# Patient Record
Sex: Male | Born: 1995 | Race: White | Hispanic: No | Marital: Single | Smoking: Never smoker
Health system: Southern US, Community
[De-identification: ages and names within clinical notes are randomized; demographics above are authoritative.]

## PROBLEM LIST (undated history)

## (undated) DIAGNOSIS — F419 Anxiety disorder, unspecified: Secondary | ICD-10-CM

## (undated) HISTORY — DX: Anxiety disorder, unspecified: F41.9

---
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
## 2013-03-20 ENCOUNTER — Telehealth: Payer: Self-pay | Admitting: Internal Medicine

## 2013-03-20 NOTE — Telephone Encounter (Signed)
Patient requesting copies of childhood shot records. Ordered chart from warehouse for possible copy. Instructed patient [facility] Department resource for copies. rmf

## 2014-04-02 ENCOUNTER — Encounter: Payer: Self-pay | Admitting: Internal Medicine

## 2014-04-02 ENCOUNTER — Ambulatory Visit (INDEPENDENT_AMBULATORY_CARE_PROVIDER_SITE_OTHER): Payer: 59 | Admitting: Internal Medicine

## 2014-04-02 ENCOUNTER — Telehealth: Payer: Self-pay | Admitting: Internal Medicine

## 2014-04-02 VITALS — BP 122/70 | HR 68 | Temp 98.3°F | Resp 16 | Wt 203.0 lb

## 2014-04-02 DIAGNOSIS — R635 Abnormal weight gain: Secondary | ICD-10-CM

## 2014-04-02 DIAGNOSIS — T63441A Toxic effect of venom of bees, accidental (unintentional), initial encounter: Secondary | ICD-10-CM | POA: Insufficient documentation

## 2014-04-02 DIAGNOSIS — T63441S Toxic effect of venom of bees, accidental (unintentional), sequela: Secondary | ICD-10-CM

## 2014-04-02 DIAGNOSIS — T6591XS Toxic effect of unspecified substance, accidental (unintentional), sequela: Secondary | ICD-10-CM

## 2014-04-02 DIAGNOSIS — B081 Molluscum contagiosum: Secondary | ICD-10-CM

## 2014-04-02 DIAGNOSIS — R634 Abnormal weight loss: Secondary | ICD-10-CM

## 2014-04-02 DIAGNOSIS — F411 Generalized anxiety disorder: Secondary | ICD-10-CM

## 2014-04-02 DIAGNOSIS — T6391XA Toxic effect of contact with unspecified venomous animal, accidental (unintentional), initial encounter: Secondary | ICD-10-CM

## 2014-04-02 DIAGNOSIS — Z0000 Encounter for general adult medical examination without abnormal findings: Secondary | ICD-10-CM

## 2014-04-02 MED ORDER — LORATADINE 10 MG PO TABS: 10.0000 mg | ORAL_TABLET | Freq: Every day | ORAL | Status: DC | PRN

## 2014-04-02 MED ORDER — TRIAMCINOLONE ACETONIDE 0.5 % EX CREA
1.0000 | TOPICAL_CREAM | Freq: Four times a day (QID) | CUTANEOUS | Status: DC
Start: 2014-04-02 — End: 2015-08-11

## 2014-04-02 NOTE — Assessment & Plan Note (Signed)
Wt Readings from Last 3 Encounters:  04/02/14 203 lb (92.08 kg) (93%*, Z = 1.50)  01/27/10 204 lb 8 oz (92.761 kg) (99%*, Z = 2.23)  08/08/09 201 lb (91.173 kg) (99%*, Z = 2.29)   * Growth percentiles are based on CDC 2-20 Years data.

## 2014-04-02 NOTE — Assessment & Plan Note (Signed)
Triamc Rx Zyrtec

## 2014-04-02 NOTE — Telephone Encounter (Signed)
Patient's mother left message.  (She is not on his [employer] form)  She states that Eduin should have asked for blood work to be done when he was in for office visit today.  She states that he has lost 50 lbs and she wants to be sure that he is okay.

## 2014-04-02 NOTE — Telephone Encounter (Signed)
Sorry, will order labs: he can come today or next week. He did not mention labs. It sounds like he lost wt because he is eating better. Thx

## 2014-04-02 NOTE — Progress Notes (Signed)
Pre visit review using our clinic review tool, if applicable. No additional management support is needed unless otherwise documented below in the visit note. 

## 2014-04-02 NOTE — Assessment & Plan Note (Signed)
Discused Triamc cream Rx

## 2014-04-03 ENCOUNTER — Telehealth: Payer: Self-pay | Admitting: Internal Medicine

## 2014-04-03 NOTE — Telephone Encounter (Signed)
Relevant patient education assigned to patient using Emmi. ° °

## 2014-04-07 NOTE — Telephone Encounter (Signed)
Left detailed mess informing pt of below.  

## 2014-06-06 ENCOUNTER — Encounter: Payer: Self-pay | Admitting: Internal Medicine

## 2014-06-06 NOTE — Assessment & Plan Note (Addendum)
2014-15 Separation anxiety Counseling at [facility]

## 2014-06-10 ENCOUNTER — Telehealth: Payer: Self-pay | Admitting: Internal Medicine

## 2014-06-10 ENCOUNTER — Ambulatory Visit (INDEPENDENT_AMBULATORY_CARE_PROVIDER_SITE_OTHER): Payer: BC Managed Care – PPO

## 2014-06-10 DIAGNOSIS — R718 Other abnormality of red blood cells: Secondary | ICD-10-CM

## 2014-06-10 DIAGNOSIS — F4322 Adjustment disorder with anxiety: Secondary | ICD-10-CM

## 2014-06-10 DIAGNOSIS — R634 Abnormal weight loss: Secondary | ICD-10-CM

## 2014-06-10 DIAGNOSIS — Z0000 Encounter for general adult medical examination without abnormal findings: Secondary | ICD-10-CM

## 2014-06-10 LAB — BASIC METABOLIC PANEL
BUN: 13 mg/dL (ref 6–23)
CO2: 30 meq/L (ref 19–32)
CREATININE: 1 mg/dL (ref 0.4–1.5)
Calcium: 9.7 mg/dL (ref 8.4–10.5)
Chloride: 102 mEq/L (ref 96–112)
GFR: 99.2 mL/min (ref >60.00)
GLUCOSE: 89 mg/dL (ref 70–99)
Potassium: 4.3 mEq/L (ref 3.5–5.1)
SODIUM: 138 meq/L (ref 135–145)

## 2014-06-10 LAB — CBC WITH DIFFERENTIAL/PLATELET
Basophils Absolute: 0 10*3/uL (ref 0.0–0.1)
Basophils Relative: 0.4 % (ref 0.0–3.0)
EOS ABS: 0.3 10*3/uL (ref 0.0–0.7)
Eosinophils Relative: 3.3 % (ref 0.0–5.0)
HCT: 49.5 % — ABNORMAL HIGH (ref 36.0–49.0)
HEMOGLOBIN: 17 g/dL — AB (ref 12.0–16.0)
LYMPHS PCT: 32.8 % (ref 24.0–48.0)
Lymphs Abs: 2.7 10*3/uL (ref 0.7–4.0)
MCHC: 34.4 g/dL (ref 31.0–37.0)
MCV: 89.7 fl (ref 78.0–98.0)
MONOS PCT: 8 % (ref 3.0–12.0)
Monocytes Absolute: 0.7 10*3/uL (ref 0.1–1.0)
NEUTROS ABS: 4.5 10*3/uL (ref 1.4–7.7)
Neutrophils Relative %: 55.5 % (ref 43.0–71.0)
Platelets: 256 10*3/uL (ref 150.0–575.0)
RBC: 5.52 Mil/uL (ref 3.80–5.70)
RDW: 13.4 % (ref 11.4–15.5)
WBC: 8.2 10*3/uL (ref 4.5–13.5)

## 2014-06-10 LAB — URINALYSIS
BILIRUBIN URINE: NEGATIVE
HGB URINE DIPSTICK: NEGATIVE
KETONES UR: NEGATIVE
Leukocytes, UA: NEGATIVE
Nitrite: NEGATIVE
SPECIFIC GRAVITY, URINE: 1.02 (ref 1.000–1.030)
Total Protein, Urine: NEGATIVE
URINE GLUCOSE: NEGATIVE
Urobilinogen, UA: 0.2 (ref 0.0–1.0)
pH: 5.5 (ref 5.0–8.0)

## 2014-06-10 LAB — LIPID PANEL
CHOL/HDL RATIO: 3
Cholesterol: 153 mg/dL (ref 0–200)
HDL: 54.5 mg/dL (ref >39.00)
LDL CALC: 73 mg/dL (ref 0–99)
NONHDL: 98.5
Triglycerides: 127 mg/dL (ref 0.0–149.0)
VLDL: 25.4 mg/dL (ref 0.0–40.0)

## 2014-06-10 LAB — HEPATIC FUNCTION PANEL
ALK PHOS: 55 U/L (ref 52–171)
ALT: 14 U/L (ref 0–53)
AST: 18 U/L (ref 0–37)
Albumin: 4.4 g/dL (ref 3.5–5.2)
BILIRUBIN TOTAL: 0.7 mg/dL (ref 0.2–1.2)
Bilirubin, Direct: 0.1 mg/dL (ref 0.0–0.3)
Total Protein: 7.9 g/dL (ref 6.0–8.3)

## 2014-06-10 LAB — TSH: TSH: 1.58 u[IU]/mL (ref 0.40–5.00)

## 2014-06-10 LAB — HIV ANTIBODY (ROUTINE TESTING W REFLEX): HIV: NONREACTIVE

## 2014-06-10 LAB — VITAMIN B12: Vitamin B-12: 489 pg/mL (ref 211–911)

## 2014-06-10 NOTE — Telephone Encounter (Signed)
Patient's Mom is requesting a call back regarding labs. She wants to know which labs are "necessary" for him to have drawn out of the ones standing in his chart. Says that [facility] faxed over some lab results that he had drawn recently and he does not want him to have the same ones duplicated. Please advise.

## 2014-06-10 NOTE — Telephone Encounter (Signed)
Have you seen labs from [facility]? What labs do you want ran?

## 2014-06-10 NOTE — Telephone Encounter (Signed)
I saw [facility] labs on paper, but I do not see them scanned in the chart. As I recall, it was CBC and BMET Thx

## 2014-06-11 NOTE — Telephone Encounter (Signed)
I called pt's mother- Copy of pt's labs are upfront for her to p/u. She is leaving her copy of his [facility] labs.   She is requesting your recommendation on a forensic psychiatrist or psychologist that is not very aggressive and that would be a good match with pt. Please advise.

## 2014-06-14 NOTE — Telephone Encounter (Signed)
I spoke w/mom Will sch w/Dr Leland Johns

## 2014-06-17 ENCOUNTER — Encounter: Payer: Self-pay | Admitting: Internal Medicine

## 2014-06-17 NOTE — Progress Notes (Signed)
   Subjective:    Patient ID: Christian Mccann, male    DOB: 1996-05-17, 18 y.o.   MRN: [id number]  HPI C/o rash on the trunk, bee stings, anxiety (mom is abroad), problems at school [employer])   Review of Systems  Constitutional: Positive for unexpected weight change. Negative for appetite change and fatigue.  HENT: Negative for congestion, nosebleeds, sneezing, sore throat and trouble swallowing.   Eyes: Negative for itching and visual disturbance.  Respiratory: Negative for cough.   Cardiovascular: Negative for chest pain, palpitations and leg swelling.  Gastrointestinal: Negative for nausea, diarrhea, blood in stool and abdominal distention.  Genitourinary: Negative for frequency and hematuria.  Musculoskeletal: Negative for back pain, gait problem, joint swelling and neck pain.  Skin: Positive for rash.  Neurological: Negative for dizziness, tremors, speech difficulty and weakness.  Hematological: Does not bruise/bleed easily.  Psychiatric/Behavioral: Negative for suicidal ideas, behavioral problems, sleep disturbance, self-injury, dysphoric mood and agitation. The patient is nervous/anxious.        Objective:   Physical Exam  Constitutional: He is oriented to person, place, and time. He appears well-developed. No distress.  NAD A little overweight  HENT:  Mouth/Throat: Oropharynx is clear and moist.  Eyes: Conjunctivae are normal. Pupils are equal, round, and reactive to light.  Neck: Normal range of motion. No JVD present. No thyromegaly present.  Cardiovascular: Normal rate, regular rhythm, normal heart sounds and intact distal pulses.  Exam reveals no gallop and no friction rub.   No murmur heard. Pulmonary/Chest: Effort normal and breath sounds normal. No respiratory distress. He has no wheezes. He has no rales. He exhibits no tenderness.  Abdominal: Soft. Bowel sounds are normal. He exhibits no distension and no mass. There is no tenderness. There is no rebound and no  guarding.  Musculoskeletal: Normal range of motion. He exhibits no edema and no tenderness.  Lymphadenopathy:    He has no cervical adenopathy.  Neurological: He is alert and oriented to person, place, and time. He has normal reflexes. No cranial nerve deficit. He exhibits normal muscle tone. He displays a negative Romberg sign. Coordination and gait normal.  Skin: Skin is warm and dry. Rash noted.  Psychiatric: He has a normal mood and affect. His behavior is normal. Judgment and thought content normal.   Pearly papules on abdomen 3-4 mm each       Assessment & Plan:

## 2014-06-25 ENCOUNTER — Ambulatory Visit: Payer: Self-pay | Admitting: Psychology

## 2014-10-13 ENCOUNTER — Telehealth: Payer: Self-pay | Admitting: Internal Medicine

## 2014-10-13 NOTE — Telephone Encounter (Signed)
Received records from [facility], sent to Dr. Alain Marion. 10/13/14/ss

## 2014-10-29 ENCOUNTER — Telehealth: Payer: Self-pay | Admitting: Internal Medicine

## 2014-10-29 NOTE — Telephone Encounter (Signed)
Rec'd from [facility] forward 3 pages to Dr. Alain Marion

## 2014-10-30 ENCOUNTER — Ambulatory Visit: Payer: Self-pay | Admitting: Family Medicine

## 2014-11-02 ENCOUNTER — Encounter: Payer: Self-pay | Admitting: Internal Medicine

## 2014-11-02 ENCOUNTER — Ambulatory Visit (INDEPENDENT_AMBULATORY_CARE_PROVIDER_SITE_OTHER): Payer: BLUE CROSS/BLUE SHIELD | Admitting: Internal Medicine

## 2014-11-02 ENCOUNTER — Other Ambulatory Visit (INDEPENDENT_AMBULATORY_CARE_PROVIDER_SITE_OTHER): Payer: BLUE CROSS/BLUE SHIELD

## 2014-11-02 VITALS — BP 130/90 | HR 68 | Temp 98.5°F | Wt 216.0 lb

## 2014-11-02 DIAGNOSIS — R51 Headache: Secondary | ICD-10-CM

## 2014-11-02 DIAGNOSIS — R509 Fever, unspecified: Secondary | ICD-10-CM

## 2014-11-02 DIAGNOSIS — G4452 New daily persistent headache (NDPH): Secondary | ICD-10-CM

## 2014-11-02 DIAGNOSIS — R519 Headache, unspecified: Secondary | ICD-10-CM | POA: Insufficient documentation

## 2014-11-02 LAB — HEPATIC FUNCTION PANEL
ALK PHOS: 49 U/L (ref 39–117)
ALT: 13 U/L (ref 0–53)
AST: 17 U/L (ref 0–37)
Albumin: 4.9 g/dL (ref 3.5–5.2)
BILIRUBIN DIRECT: 0.1 mg/dL (ref 0.0–0.3)
TOTAL PROTEIN: 8.2 g/dL (ref 6.0–8.3)
Total Bilirubin: 1 mg/dL (ref 0.2–1.2)

## 2014-11-02 LAB — BASIC METABOLIC PANEL
BUN: 14 mg/dL (ref 6–23)
CALCIUM: 9.8 mg/dL (ref 8.4–10.5)
CHLORIDE: 102 meq/L (ref 96–112)
CO2: 28 mEq/L (ref 19–32)
CREATININE: 1.1 mg/dL (ref 0.4–1.5)
GFR: 93.49 mL/min (ref >60.00)
Glucose, Bld: 67 mg/dL — ABNORMAL LOW (ref 70–99)
Potassium: 4.8 mEq/L (ref 3.5–5.1)
Sodium: 138 mEq/L (ref 135–145)

## 2014-11-02 LAB — CBC WITH DIFFERENTIAL/PLATELET
BASOS ABS: 0 10*3/uL (ref 0.0–0.1)
Basophils Relative: 0.3 % (ref 0.0–3.0)
EOS PCT: 2.5 % (ref 0.0–5.0)
Eosinophils Absolute: 0.2 10*3/uL (ref 0.0–0.7)
HEMATOCRIT: 51.7 % (ref 39.0–52.0)
Hemoglobin: 17.6 g/dL — ABNORMAL HIGH (ref 13.0–17.0)
LYMPHS PCT: 23.7 % (ref 12.0–46.0)
Lymphs Abs: 1.6 10*3/uL (ref 0.7–4.0)
MCHC: 34.1 g/dL (ref 30.0–36.0)
MCV: 89.2 fl (ref 78.0–100.0)
MONOS PCT: 8.4 % (ref 3.0–12.0)
Monocytes Absolute: 0.6 10*3/uL (ref 0.1–1.0)
NEUTROS ABS: 4.3 10*3/uL (ref 1.4–7.7)
Neutrophils Relative %: 65.1 % (ref 43.0–77.0)
Platelets: 267 10*3/uL (ref 150.0–400.0)
RBC: 5.79 Mil/uL (ref 4.22–5.81)
RDW: 12.7 % (ref 11.5–14.6)
WBC: 6.6 10*3/uL (ref 4.5–10.5)

## 2014-11-02 LAB — MONONUCLEOSIS SCREEN: Mono Screen: NEGATIVE

## 2014-11-02 LAB — URINALYSIS
Bilirubin Urine: NEGATIVE
HGB URINE DIPSTICK: NEGATIVE
Ketones, ur: NEGATIVE
Leukocytes, UA: NEGATIVE
NITRITE: NEGATIVE
Specific Gravity, Urine: 1.02 (ref 1.000–1.030)
TOTAL PROTEIN, URINE-UPE24: NEGATIVE
URINE GLUCOSE: NEGATIVE
UROBILINOGEN UA: 0.2 (ref 0.0–1.0)
pH: 7 (ref 5.0–8.0)

## 2014-11-02 MED ORDER — PROMETHAZINE HCL 25 MG PO TABS: 25.0000 mg | ORAL_TABLET | Freq: Four times a day (QID) | ORAL | Status: DC | PRN

## 2014-11-02 MED ORDER — INDOMETHACIN 50 MG PO CAPS: 50.0000 mg | ORAL_CAPSULE | Freq: Two times a day (BID) | ORAL | Status: DC

## 2014-11-02 NOTE — Assessment & Plan Note (Addendum)
x7 days - viral syndrome. Nonfocal exam, no meningeal signs. Labs Indocin Promethazine

## 2014-11-02 NOTE — Progress Notes (Signed)
Pre visit review using our clinic review tool, if applicable. No additional management support is needed unless otherwise documented below in the visit note. 

## 2014-11-02 NOTE — Assessment & Plan Note (Addendum)
x7 d viral syndrome. Pt doesn't appear toxic Labs

## 2014-11-02 NOTE — Patient Instructions (Signed)
Use over-the-counter  "cold" medicines  such as "Tylenol cold"  ,  "Mucinex" or" Mucinex D"  for cough and congestion if you have it.   Go to ER if worse

## 2014-11-02 NOTE — Progress Notes (Signed)
   Subjective:    Headache  This is a new problem. The current episode started in the past 7 days. The pain is located in the bilateral region. The patient is experiencing no pain. Associated symptoms include dizziness. Pertinent negatives include no back pain, coughing, nausea, neck pain, sore throat or weakness. The symptoms are aggravated by activity.  Dizziness This is a new problem. The problem occurs intermittently. Associated symptoms include headaches and a rash. Pertinent negatives include no chest pain, congestion, coughing, fatigue, joint swelling, nausea, neck pain, sore throat or weakness. The symptoms are aggravated by standing.  His girlfriend was sick w/similar illness last week... Was taking Naproxen - helped for 1-2 hrs No cough, no runny nose, no ST, no N/V/D   Review of Systems  Constitutional: Positive for unexpected weight change. Negative for appetite change and fatigue.  HENT: Negative for congestion, nosebleeds, sneezing, sore throat and trouble swallowing.   Eyes: Negative for itching and visual disturbance.  Respiratory: Negative for cough.   Cardiovascular: Negative for chest pain, palpitations and leg swelling.  Gastrointestinal: Negative for nausea, diarrhea, blood in stool and abdominal distention.  Genitourinary: Negative for frequency and hematuria.  Musculoskeletal: Negative for back pain, joint swelling, gait problem and neck pain.  Skin: Positive for rash.  Neurological: Positive for dizziness and headaches. Negative for tremors, speech difficulty and weakness.  Hematological: Does not bruise/bleed easily.  Psychiatric/Behavioral: Negative for suicidal ideas, behavioral problems, sleep disturbance, self-injury, dysphoric mood and agitation. The patient is nervous/anxious.        Objective:   Physical Exam  Constitutional: He is oriented to person, place, and time. He appears well-developed. No distress.  NAD A little overweight  HENT:    Mouth/Throat: Oropharynx is clear and moist.  Eyes: Conjunctivae are normal. Pupils are equal, round, and reactive to light.  Neck: Normal range of motion. No JVD present. No thyromegaly present.  Cardiovascular: Normal rate, regular rhythm, normal heart sounds and intact distal pulses.  Exam reveals no gallop and no friction rub.   No murmur heard. Pulmonary/Chest: Effort normal and breath sounds normal. No respiratory distress. He has no wheezes. He has no rales. He exhibits no tenderness.  Abdominal: Soft. Bowel sounds are normal. He exhibits no distension and no mass. There is no tenderness. There is no rebound and no guarding.  Musculoskeletal: Normal range of motion. He exhibits no edema or tenderness.  Lymphadenopathy:    He has no cervical adenopathy.  Neurological: He is alert and oriented to person, place, and time. He has normal reflexes. No cranial nerve deficit. He exhibits normal muscle tone. He displays a negative Romberg sign. Coordination and gait normal.  Skin: Skin is warm and dry. Rash noted.  Psychiatric: He has a normal mood and affect. His behavior is normal. Judgment and thought content normal.  No meningeal signs Eryth papules on L forehead, small on L      Assessment & Plan:

## 2014-11-03 ENCOUNTER — Encounter: Payer: Self-pay | Admitting: Internal Medicine

## 2015-08-11 ENCOUNTER — Other Ambulatory Visit: Payer: 59

## 2015-08-11 ENCOUNTER — Encounter: Payer: Self-pay | Admitting: Internal Medicine

## 2015-08-11 ENCOUNTER — Ambulatory Visit (INDEPENDENT_AMBULATORY_CARE_PROVIDER_SITE_OTHER): Payer: 59 | Admitting: Internal Medicine

## 2015-08-11 ENCOUNTER — Other Ambulatory Visit (INDEPENDENT_AMBULATORY_CARE_PROVIDER_SITE_OTHER): Payer: 59

## 2015-08-11 VITALS — BP 110/70 | HR 71 | Wt 225.0 lb

## 2015-08-11 DIAGNOSIS — K219 Gastro-esophageal reflux disease without esophagitis: Secondary | ICD-10-CM | POA: Diagnosis not present

## 2015-08-11 LAB — CBC WITH DIFFERENTIAL/PLATELET
BASOS PCT: 0.5 % (ref 0.0–3.0)
Basophils Absolute: 0 10*3/uL (ref 0.0–0.1)
EOS PCT: 1.8 % (ref 0.0–5.0)
Eosinophils Absolute: 0.2 10*3/uL (ref 0.0–0.7)
HEMATOCRIT: 51.8 % (ref 39.0–52.0)
Hemoglobin: 17.4 g/dL — ABNORMAL HIGH (ref 13.0–17.0)
LYMPHS PCT: 28.2 % (ref 12.0–46.0)
Lymphs Abs: 2.4 10*3/uL (ref 0.7–4.0)
MCHC: 33.6 g/dL (ref 30.0–36.0)
MCV: 90.2 fl (ref 78.0–100.0)
MONOS PCT: 8.6 % (ref 3.0–12.0)
Monocytes Absolute: 0.7 10*3/uL (ref 0.1–1.0)
NEUTROS ABS: 5.2 10*3/uL (ref 1.4–7.7)
Neutrophils Relative %: 60.9 % (ref 43.0–77.0)
Platelets: 283 10*3/uL (ref 150.0–400.0)
RBC: 5.75 Mil/uL (ref 4.22–5.81)
RDW: 13 % (ref 11.5–14.6)
WBC: 8.6 10*3/uL (ref 4.5–10.5)

## 2015-08-11 LAB — LIPASE: Lipase: 10 U/L — ABNORMAL LOW (ref 11.0–59.0)

## 2015-08-11 LAB — BASIC METABOLIC PANEL
BUN: 13 mg/dL (ref 6–23)
CALCIUM: 10.1 mg/dL (ref 8.4–10.5)
CO2: 30 meq/L (ref 19–32)
Chloride: 103 mEq/L (ref 96–112)
Creatinine, Ser: 1.09 mg/dL (ref 0.40–1.50)
GFR: 90.82 mL/min (ref >60.00)
GLUCOSE: 91 mg/dL (ref 70–99)
Potassium: 4.4 mEq/L (ref 3.5–5.1)
SODIUM: 140 meq/L (ref 135–145)

## 2015-08-11 LAB — HEPATIC FUNCTION PANEL
ALBUMIN: 4.7 g/dL (ref 3.5–5.2)
ALK PHOS: 59 U/L (ref 39–117)
ALT: 14 U/L (ref 0–53)
AST: 17 U/L (ref 0–37)
Bilirubin, Direct: 0.1 mg/dL (ref 0.0–0.3)
TOTAL PROTEIN: 8.1 g/dL (ref 6.0–8.3)
Total Bilirubin: 0.7 mg/dL (ref 0.2–1.2)

## 2015-08-11 LAB — H. PYLORI ANTIBODY, IGG: H PYLORI IGG: NEGATIVE

## 2015-08-11 MED ORDER — PANTOPRAZOLE SODIUM 40 MG PO TBEC: 40.0000 mg | DELAYED_RELEASE_TABLET | Freq: Every day | ORAL | Status: DC

## 2015-08-11 NOTE — Progress Notes (Signed)
Subjective:  Patient ID: Christian Mccann, male    DOB: 1996/05/15  Age: 19 y.o. MRN: [id number]  CC: No chief complaint on file.   HPI Christian Mccann presents for GERD sx' worse at night, some hungry pains. No NSAIDs  Outpatient Prescriptions Prior to Visit  Medication Sig Dispense Refill  . loratadine (CLARITIN) 10 MG tablet Take 1 tablet (10 mg total) by mouth daily as needed for allergies. (Patient not taking: Reported on 08/11/2015) 100 tablet 3  . indomethacin (INDOCIN) 50 MG capsule Take 1 capsule (50 mg total) by mouth 2 (two) times daily with a meal. (Patient not taking: Reported on 08/11/2015) 30 capsule 1  . promethazine (PHENERGAN) 25 MG tablet Take 1 tablet (25 mg total) by mouth every 6 (six) hours as needed for nausea or vomiting. (Patient not taking: Reported on 08/11/2015) 30 tablet 0  . triamcinolone cream (KENALOG) 0.5 % Apply 1 application topically 4 (four) times daily. Use on rash (Patient not taking: Reported on 08/11/2015) 45 g 1   No facility-administered medications prior to visit.    ROS Review of Systems  Constitutional: Negative for appetite change, fatigue and unexpected weight change.  HENT: Negative for congestion, nosebleeds, sneezing, sore throat and trouble swallowing.   Eyes: Negative for itching and visual disturbance.  Respiratory: Negative for cough and stridor.   Cardiovascular: Negative for chest pain, palpitations and leg swelling.  Gastrointestinal: Negative for nausea, diarrhea, blood in stool and abdominal distention.  Genitourinary: Negative for frequency and hematuria.  Musculoskeletal: Negative for back pain, joint swelling, gait problem and neck pain.  Skin: Negative for rash.  Neurological: Negative for dizziness, tremors, speech difficulty and weakness.  Psychiatric/Behavioral: Negative for suicidal ideas, sleep disturbance, dysphoric mood and agitation. The patient is not nervous/anxious.     Objective:  BP 110/70 mmHg   Pulse 71  Wt 225 lb (102.059 kg)  SpO2 97%  BP Readings from Last 3 Encounters:  08/11/15 110/70  11/02/14 130/90  04/02/14 122/70    Wt Readings from Last 3 Encounters:  08/11/15 225 lb (102.059 kg)  11/02/14 216 lb (97.977 kg)  04/02/14 203 lb (92.08 kg) (93 %*, Z = 1.50)   * Growth percentiles are based on CDC 2-20 Years data.    Physical Exam  Constitutional: He is oriented to person, place, and time. He appears well-developed. No distress.  NAD  HENT:  Mouth/Throat: Oropharynx is clear and moist.  Eyes: Conjunctivae are normal. Pupils are equal, round, and reactive to light.  Neck: Normal range of motion. No JVD present. No thyromegaly present.  Cardiovascular: Normal rate, regular rhythm, normal heart sounds and intact distal pulses.  Exam reveals no gallop and no friction rub.   No murmur heard. Pulmonary/Chest: Effort normal and breath sounds normal. No respiratory distress. He has no wheezes. He has no rales. He exhibits no tenderness.  Abdominal: Soft. Bowel sounds are normal. He exhibits no distension and no mass. There is no tenderness. There is no rebound and no guarding.  Musculoskeletal: Normal range of motion. He exhibits no edema or tenderness.  Lymphadenopathy:    He has no cervical adenopathy.  Neurological: He is alert and oriented to person, place, and time. He has normal reflexes. No cranial nerve deficit. He exhibits normal muscle tone. He displays a negative Romberg sign. Coordination and gait normal.  Skin: Skin is warm and dry. No rash noted.  Psychiatric: He has a normal mood and affect. His behavior is normal. Judgment and  thought content normal.    Lab Results  Component Value Date   WBC 8.6 08/11/2015   HGB 17.4* 08/11/2015   HCT 51.8 08/11/2015   PLT 283.0 08/11/2015   GLUCOSE 91 08/11/2015   CHOL 153 06/10/2014   TRIG 127.0 06/10/2014   HDL 54.50 06/10/2014   LDLCALC 73 06/10/2014   ALT 14 08/11/2015   AST 17 08/11/2015   NA 140  08/11/2015   K 4.4 08/11/2015   CL 103 08/11/2015   CREATININE 1.09 08/11/2015   BUN 13 08/11/2015   CO2 30 08/11/2015   TSH 1.58 06/10/2014    No results found.  Assessment & Plan:   Diagnoses and all orders for this visit:  Gastroesophageal reflux disease without esophagitis -     CBC with Differential/Platelet; Future -     Basic metabolic panel; Future -     Hepatic function panel; Future -     H. pylori antibody, IgG; Future -     Lipase; Future  Other orders -     pantoprazole (PROTONIX) 40 MG tablet; Take 1 tablet (40 mg total) by mouth daily.  I have discontinued [name] triamcinolone cream, promethazine, and indomethacin. I am also having him start on pantoprazole. Additionally, I am having him maintain his loratadine.  Meds ordered this encounter  Medications  . pantoprazole (PROTONIX) 40 MG tablet    Sig: Take 1 tablet (40 mg total) by mouth daily.    Dispense:  90 tablet    Refill:  3     Follow-up: Return in about 3 months (around 11/11/2015) for a follow-up visit.  Walker Kehr, MD

## 2015-08-11 NOTE — Progress Notes (Signed)
Pre visit review using our clinic review tool, if applicable. No additional management support is needed unless otherwise documented below in the visit note. 

## 2015-08-11 NOTE — Assessment & Plan Note (Signed)
Labs Protonix 

## 2016-01-31 DIAGNOSIS — F609 Personality disorder, unspecified: Secondary | ICD-10-CM | POA: Diagnosis not present

## 2016-02-14 DIAGNOSIS — F609 Personality disorder, unspecified: Secondary | ICD-10-CM | POA: Diagnosis not present

## 2016-02-21 DIAGNOSIS — F609 Personality disorder, unspecified: Secondary | ICD-10-CM | POA: Diagnosis not present

## 2016-02-29 DIAGNOSIS — F609 Personality disorder, unspecified: Secondary | ICD-10-CM | POA: Diagnosis not present

## 2016-03-06 DIAGNOSIS — F609 Personality disorder, unspecified: Secondary | ICD-10-CM | POA: Diagnosis not present

## 2016-03-07 DIAGNOSIS — G43A0 Cyclical vomiting, not intractable: Secondary | ICD-10-CM | POA: Diagnosis not present

## 2016-03-15 DIAGNOSIS — F609 Personality disorder, unspecified: Secondary | ICD-10-CM | POA: Diagnosis not present

## 2016-03-29 DIAGNOSIS — F609 Personality disorder, unspecified: Secondary | ICD-10-CM | POA: Diagnosis not present

## 2016-04-10 DIAGNOSIS — F609 Personality disorder, unspecified: Secondary | ICD-10-CM | POA: Diagnosis not present

## 2016-04-12 DIAGNOSIS — F609 Personality disorder, unspecified: Secondary | ICD-10-CM | POA: Diagnosis not present

## 2016-05-10 DIAGNOSIS — F609 Personality disorder, unspecified: Secondary | ICD-10-CM | POA: Diagnosis not present

## 2016-05-15 DIAGNOSIS — F609 Personality disorder, unspecified: Secondary | ICD-10-CM | POA: Diagnosis not present

## 2016-05-17 DIAGNOSIS — F609 Personality disorder, unspecified: Secondary | ICD-10-CM | POA: Diagnosis not present

## 2016-05-22 DIAGNOSIS — F609 Personality disorder, unspecified: Secondary | ICD-10-CM | POA: Diagnosis not present

## 2016-05-29 DIAGNOSIS — F609 Personality disorder, unspecified: Secondary | ICD-10-CM | POA: Diagnosis not present

## 2016-06-07 DIAGNOSIS — F609 Personality disorder, unspecified: Secondary | ICD-10-CM | POA: Diagnosis not present

## 2016-06-19 DIAGNOSIS — F609 Personality disorder, unspecified: Secondary | ICD-10-CM | POA: Diagnosis not present

## 2016-07-03 DIAGNOSIS — F609 Personality disorder, unspecified: Secondary | ICD-10-CM | POA: Diagnosis not present

## 2016-07-05 DIAGNOSIS — F609 Personality disorder, unspecified: Secondary | ICD-10-CM | POA: Diagnosis not present

## 2016-07-10 DIAGNOSIS — F609 Personality disorder, unspecified: Secondary | ICD-10-CM | POA: Diagnosis not present

## 2016-07-22 DIAGNOSIS — J0140 Acute pansinusitis, unspecified: Secondary | ICD-10-CM | POA: Diagnosis not present

## 2016-07-22 DIAGNOSIS — R05 Cough: Secondary | ICD-10-CM | POA: Diagnosis not present

## 2016-07-24 DIAGNOSIS — F609 Personality disorder, unspecified: Secondary | ICD-10-CM | POA: Diagnosis not present

## 2016-09-04 DIAGNOSIS — F609 Personality disorder, unspecified: Secondary | ICD-10-CM | POA: Diagnosis not present

## 2016-09-12 DIAGNOSIS — M76812 Anterior tibial syndrome, left leg: Secondary | ICD-10-CM | POA: Diagnosis not present

## 2016-09-12 DIAGNOSIS — M71572 Other bursitis, not elsewhere classified, left ankle and foot: Secondary | ICD-10-CM | POA: Diagnosis not present

## 2016-09-12 DIAGNOSIS — M71571 Other bursitis, not elsewhere classified, right ankle and foot: Secondary | ICD-10-CM | POA: Diagnosis not present

## 2016-09-12 DIAGNOSIS — M76811 Anterior tibial syndrome, right leg: Secondary | ICD-10-CM | POA: Diagnosis not present

## 2016-09-18 DIAGNOSIS — F609 Personality disorder, unspecified: Secondary | ICD-10-CM | POA: Diagnosis not present

## 2017-03-12 ENCOUNTER — Encounter: Payer: Self-pay | Admitting: Internal Medicine

## 2017-03-12 ENCOUNTER — Ambulatory Visit (INDEPENDENT_AMBULATORY_CARE_PROVIDER_SITE_OTHER): Payer: BLUE CROSS/BLUE SHIELD | Admitting: Internal Medicine

## 2017-03-12 ENCOUNTER — Telehealth: Payer: Self-pay | Admitting: Internal Medicine

## 2017-03-12 DIAGNOSIS — F411 Generalized anxiety disorder: Secondary | ICD-10-CM

## 2017-03-12 DIAGNOSIS — F39 Unspecified mood [affective] disorder: Secondary | ICD-10-CM

## 2017-03-12 MED ORDER — PAROXETINE HCL ER 12.5 MG PO TB24: 12.5000 mg | ORAL_TABLET | Freq: Every day | ORAL | 2 refills | Status: DC

## 2017-03-12 NOTE — Telephone Encounter (Signed)
He called crying this morning and is experiencing panic attack, headaches.  Cant sleep.  Cant stop crying.     Best number  [phone]

## 2017-03-12 NOTE — Progress Notes (Signed)
Subjective:  Patient ID: Christian Mccann, male    DOB: 18-Nov-1995  Age: 21 y.o. MRN: [id number]  CC: No chief complaint on file.   HPI [name] presents for being anxious, moody and upset with himself. Comes w/mom [name] (worked in) from [employer]... 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 Two of his friends committed a suicide in the past year... He used a lot of weed in the past year, but he ran out of money 1+ week ago - using much less lately... Dalbert has been seeing a counselor   Outpatient Medications Prior to Visit  Medication Sig Dispense Refill  . loratadine (CLARITIN) 10 MG tablet Take 1 tablet (10 mg total) by mouth daily as needed for allergies. (Patient not taking: Reported on 08/11/2015) 100 tablet 3  . pantoprazole (PROTONIX) 40 MG tablet Take 1 tablet (40 mg total) by mouth daily. 90 tablet 3   No facility-administered medications prior to visit.     ROS Review of Systems  Constitutional: Positive for fatigue. Negative for appetite change and unexpected weight change.  HENT: Negative for congestion, nosebleeds, sneezing, sore throat and trouble swallowing.   Eyes: Negative for itching and visual disturbance.  Respiratory: Negative for cough.   Cardiovascular: Negative for chest pain, palpitations and leg swelling.  Gastrointestinal: Negative for abdominal distention, blood in stool, diarrhea and nausea.  Genitourinary: Negative for frequency and hematuria.  Musculoskeletal: Negative for back pain, gait problem, joint swelling and neck pain.  Skin: Negative for rash.  Neurological: Negative for dizziness, tremors, speech difficulty and weakness.  Psychiatric/Behavioral: Positive for behavioral problems, decreased concentration and dysphoric mood. Negative for agitation, confusion, hallucinations, sleep disturbance and suicidal ideas. The patient is  nervous/anxious. The patient is not hyperactive.     Objective:  BP 132/90   Pulse 74   Temp 98.6 F (37 C) (Oral)   Ht 5\' 8"  (1.727 m)   Wt 219 lb (99.3 kg)   SpO2 99%   BMI 33.30 kg/m   BP Readings from Last 3 Encounters:  03/12/17 132/90  08/11/15 110/70  11/02/14 130/90    Wt Readings from Last 3 Encounters:  03/12/17 219 lb (99.3 kg)  08/11/15 225 lb (102.1 kg)  11/02/14 216 lb (98 kg)    Physical Exam  Constitutional: He is oriented to person, place, and time. He appears well-developed and well-nourished.  NAD  HENT:  Mouth/Throat: Oropharynx is clear and moist.  Eyes: Conjunctivae are normal. Pupils are equal, round, and reactive to light.  Neck: Normal range of motion.  Pulmonary/Chest: Effort normal and breath sounds normal.  Abdominal: Soft. Bowel sounds are normal.  Neurological: He is alert and oriented to person, place, and time. He has normal reflexes. He displays a negative Romberg sign. Gait normal.  Skin: Skin is warm and dry. No rash noted.  Psychiatric: His behavior is normal. Judgment and thought content normal.   FTF>20 min discussing issues wih anxiety, moodiness and his "weed" use  Lab Results  Component Value Date   WBC 8.6 08/11/2015   HGB 17.4 (H) 08/11/2015   HCT 51.8 08/11/2015   PLT 283.0 08/11/2015   GLUCOSE 91 08/11/2015   CHOL 153 06/10/2014   TRIG 127.0 06/10/2014   HDL 54.50 06/10/2014   LDLCALC 73 06/10/2014   ALT 14 08/11/2015   AST 17 08/11/2015   NA 140 08/11/2015  K 4.4 08/11/2015   CL 103 08/11/2015   CREATININE 1.09 08/11/2015   BUN 13 08/11/2015   CO2 30 08/11/2015   TSH 1.58 06/10/2014    No results found.  Assessment & Plan:   There are no diagnoses linked to this encounter. I have discontinued Mr. [place] loratadine and pantoprazole.  No orders of the defined types were placed in this encounter.    Follow-up: No Follow-up on file.  Walker Kehr, MD

## 2017-03-12 NOTE — Telephone Encounter (Signed)
Please advise 

## 2017-03-12 NOTE — Telephone Encounter (Signed)
Pts mother called and said that the pt is having severe anxiety and depression. She is wanting him to see Dr Alain Marion as soon as possible. He is in school in [place] from 3:00-5:00 and she would like to pick him up tonight so that he can be seen in the morning.. Right now there are no openings tomorrow but she wanted to see if Dr Alain Marion would work him in. The mother would like a call back. [phone].

## 2017-03-12 NOTE — Telephone Encounter (Signed)
Yes patient is coming in at 4:15, patient mother would like you to call her at your earliest convince because there are some things she cannot discuss in front of the patient.

## 2017-03-12 NOTE — Assessment & Plan Note (Addendum)
C/o stress w/work load at school He used a lot of weed in the past year, but he ran out of money 1+ week ago - using much less lately...- mild withdrawal dysphoria Meet has been seeing a Social worker. He was advised to see a psychiatrist. Advised to start Paxil - low dose. I advised Christian Mccann not to use marijuana or any other substances. We discussed dangers of drug use too

## 2017-03-12 NOTE — Telephone Encounter (Signed)
Is he coming at 4:15? Thx

## 2017-03-12 NOTE — Assessment & Plan Note (Signed)
mild THC withdrawal dysphoria lately Christian Mccann has been seeing a Social worker. He was advised to see a psychiatrist. Advised to start Paxil - low dose. I advised Christian Mccann not to use marijuana or any other substances. We discussed dangers of other drug use too

## 2017-03-19 ENCOUNTER — Telehealth: Payer: Self-pay | Admitting: Internal Medicine

## 2017-03-19 ENCOUNTER — Ambulatory Visit: Payer: Self-pay | Admitting: Internal Medicine

## 2017-03-19 MED ORDER — PAROXETINE HCL 20 MG PO TABS: 20.0000 mg | ORAL_TABLET | Freq: Every day | ORAL | 3 refills | Status: DC

## 2017-03-19 NOTE — Telephone Encounter (Signed)
Mother is questioning if 12.5mg  of Paxil is enough medication. Pt does not feel any changes. Would like to know if there is something cheaper but affective, if not she will pay the $80 dollars.   Would like to know what the additional steps for behaviorial health, and would like to know if there is an evaluation that can be done.

## 2017-03-19 NOTE — Telephone Encounter (Signed)
Noted Take 2 tablets (25 g total/d) until gone. Then, start Paroxetine 20 mg /d - should be less expensive (Rx emailed) Would [name] like to see a psychiatrist? Thx

## 2017-03-19 NOTE — Telephone Encounter (Signed)
Please advise 

## 2017-03-20 NOTE — Telephone Encounter (Signed)
Notified mom w/MD response. Mom states she will discuss option w/son about seeing psychiatrist and let MD know...Johny Chess

## 2017-06-07 ENCOUNTER — Ambulatory Visit (INDEPENDENT_AMBULATORY_CARE_PROVIDER_SITE_OTHER): Payer: BLUE CROSS/BLUE SHIELD | Admitting: Podiatry

## 2017-06-07 ENCOUNTER — Ambulatory Visit (INDEPENDENT_AMBULATORY_CARE_PROVIDER_SITE_OTHER): Payer: BLUE CROSS/BLUE SHIELD

## 2017-06-07 DIAGNOSIS — M779 Enthesopathy, unspecified: Secondary | ICD-10-CM

## 2017-06-07 DIAGNOSIS — M205X9 Other deformities of toe(s) (acquired), unspecified foot: Secondary | ICD-10-CM | POA: Diagnosis not present

## 2017-06-07 DIAGNOSIS — M21611 Bunion of right foot: Secondary | ICD-10-CM

## 2017-06-07 DIAGNOSIS — S99919A Unspecified injury of unspecified ankle, initial encounter: Secondary | ICD-10-CM

## 2017-06-10 NOTE — Progress Notes (Addendum)
  Subjective:  Patient ID: Christian Mccann, male    DOB: December 06, 1995,  MRN: [id number] HPI No chief complaint on file.   21 y.o. male presents for pain in both feet. States that he has receives injections in his ankle for "bone spurs" but not exactly sure where. States he has had inserts in the past but they are no longer helping.  Past Medical History:  Diagnosis Date  . Anxiety    No past surgical history on file.  Current Outpatient Prescriptions:  .  PARoxetine (PAXIL) 20 MG tablet, Take 1 tablet (20 mg total) by mouth daily., Disp: 30 tablet, Rfl: 3  No Known Allergies Review of Systems Objective:  There were no vitals filed for this visit. General AA&O x3. Normal mood and affect.  Vascular Dorsalis pedis and posterior tibial pulses 2/4 bilat. Brisk capillary refill to all digits. Pedal hair present.  Neurologic Epicritic sensation grossly intact.  Dermatologic No open lesions. Interspaces clear of maceration. Nails well groomed and normal in appearance.  Orthopedic: MMT 5/5 in dorsiflexion, plantarflexion, inversion, and eversion. 1st MPJ ROM decreased with loading of the forefoot. Pes planus bilat. Pain to palpation posterior ankle capsule bilat.  Some tenderness along the PT tendon bilat.   Radiographs: Taken and reviewed. No acute fractures or dislocations. Os trigonum present. 1st ray elevatus present.  Assessment & Plan:  Patient was evaluated and treated and all questions answered.  Pes Planus, Hallux Limitus, Os Trigonum -XR reviewed as above. -Will fabricate CMOs with Morton's extension, deep heel cup for agressive rearfoot control. Orthotics were ordered.  -Advised in the future patient may benefit from Os Trigonum excision if pain continues.  Return in about 4 weeks (around 07/05/2017).

## 2017-06-13 ENCOUNTER — Telehealth: Payer: Self-pay | Admitting: Podiatry

## 2017-06-13 NOTE — Telephone Encounter (Signed)
I was calling to see if Dr. Jacqualyn Posey could fill out and sign a form for me to get disability parking as I am about to go back to college at [employer]. My old podiatrist did it for me last but since I'm now seeing Dr. Jacqualyn Posey I wanted to see how I can go about getting this done. Please call me back on my cell phone at [phone]. Thank you.

## 2017-06-14 NOTE — Telephone Encounter (Signed)
Dr. March Rummage-   Do you think he needs this?

## 2017-06-14 NOTE — Telephone Encounter (Signed)
We can write one for him for a short period. Until we can get better control of his foot pain.

## 2017-06-17 NOTE — Telephone Encounter (Signed)
Alyse Low- can you take care of this? Thank you.

## 2017-06-17 NOTE — Telephone Encounter (Signed)
Apolonio Schneiders, it looks like Marcy Siren said you would do this. Is that correct? If not just let me know and I will get it taken care of. Thank you ma'am.

## 2017-07-01 ENCOUNTER — Ambulatory Visit: Payer: BLUE CROSS/BLUE SHIELD | Admitting: Orthotics

## 2017-07-01 DIAGNOSIS — M21611 Bunion of right foot: Secondary | ICD-10-CM

## 2017-07-01 DIAGNOSIS — M205X9 Other deformities of toe(s) (acquired), unspecified foot: Secondary | ICD-10-CM

## 2017-07-01 DIAGNOSIS — S99919A Unspecified injury of unspecified ankle, initial encounter: Secondary | ICD-10-CM

## 2017-07-01 NOTE — Progress Notes (Signed)
Patient came in today to pick up custom made foot orthotics.  The goals were accomplished and the patient reported no dissatisfaction with said orthotics.  Patient was advised of breakin period and how to report any issues. 

## 2017-09-19 DIAGNOSIS — F609 Personality disorder, unspecified: Secondary | ICD-10-CM | POA: Diagnosis not present

## 2017-10-29 DIAGNOSIS — F609 Personality disorder, unspecified: Secondary | ICD-10-CM | POA: Diagnosis not present

## 2017-10-31 DIAGNOSIS — F609 Personality disorder, unspecified: Secondary | ICD-10-CM | POA: Diagnosis not present

## 2017-11-06 DIAGNOSIS — F609 Personality disorder, unspecified: Secondary | ICD-10-CM | POA: Diagnosis not present

## 2017-11-19 ENCOUNTER — Ambulatory Visit: Payer: BLUE CROSS/BLUE SHIELD | Admitting: Podiatry

## 2017-11-26 DIAGNOSIS — F609 Personality disorder, unspecified: Secondary | ICD-10-CM | POA: Diagnosis not present

## 2017-12-03 ENCOUNTER — Ambulatory Visit: Payer: BLUE CROSS/BLUE SHIELD | Admitting: Podiatry

## 2017-12-03 DIAGNOSIS — F609 Personality disorder, unspecified: Secondary | ICD-10-CM | POA: Diagnosis not present

## 2017-12-10 ENCOUNTER — Ambulatory Visit: Payer: BLUE CROSS/BLUE SHIELD | Admitting: Podiatry

## 2017-12-17 DIAGNOSIS — F609 Personality disorder, unspecified: Secondary | ICD-10-CM | POA: Diagnosis not present

## 2017-12-24 ENCOUNTER — Ambulatory Visit: Payer: BLUE CROSS/BLUE SHIELD | Admitting: Podiatry

## 2017-12-24 DIAGNOSIS — F609 Personality disorder, unspecified: Secondary | ICD-10-CM | POA: Diagnosis not present

## 2017-12-31 ENCOUNTER — Ambulatory Visit: Payer: BLUE CROSS/BLUE SHIELD | Admitting: Podiatry

## 2017-12-31 DIAGNOSIS — F609 Personality disorder, unspecified: Secondary | ICD-10-CM | POA: Diagnosis not present

## 2018-01-14 DIAGNOSIS — F609 Personality disorder, unspecified: Secondary | ICD-10-CM | POA: Diagnosis not present

## 2018-01-28 DIAGNOSIS — F609 Personality disorder, unspecified: Secondary | ICD-10-CM | POA: Diagnosis not present

## 2018-08-20 ENCOUNTER — Ambulatory Visit: Payer: BLUE CROSS/BLUE SHIELD | Admitting: Internal Medicine

## 2018-08-20 ENCOUNTER — Telehealth: Payer: Self-pay | Admitting: Internal Medicine

## 2018-08-20 ENCOUNTER — Encounter: Payer: Self-pay | Admitting: Internal Medicine

## 2018-08-20 ENCOUNTER — Ambulatory Visit: Payer: BLUE CROSS/BLUE SHIELD | Admitting: Family Medicine

## 2018-08-20 ENCOUNTER — Ambulatory Visit (INDEPENDENT_AMBULATORY_CARE_PROVIDER_SITE_OTHER)
Admission: RE | Admit: 2018-08-20 | Discharge: 2018-08-20 | Disposition: A | Discharge status: Home / Self Care | Payer: BLUE CROSS/BLUE SHIELD | Source: Ambulatory Visit | Attending: Internal Medicine | Admitting: Internal Medicine

## 2018-08-20 VITALS — BP 142/86 | HR 91 | Temp 98.3°F | Ht 68.0 in | Wt 204.0 lb

## 2018-08-20 DIAGNOSIS — M25531 Pain in right wrist: Secondary | ICD-10-CM

## 2018-08-20 DIAGNOSIS — S60211A Contusion of right wrist, initial encounter: Secondary | ICD-10-CM

## 2018-08-20 DIAGNOSIS — F39 Unspecified mood [affective] disorder: Secondary | ICD-10-CM

## 2018-08-20 DIAGNOSIS — K5909 Other constipation: Secondary | ICD-10-CM

## 2018-08-20 DIAGNOSIS — K5900 Constipation, unspecified: Secondary | ICD-10-CM | POA: Insufficient documentation

## 2018-08-20 DIAGNOSIS — S60219A Contusion of unspecified wrist, initial encounter: Secondary | ICD-10-CM | POA: Insufficient documentation

## 2018-08-20 MED ORDER — LINACLOTIDE 145 MCG PO CAPS: 145.0000 ug | ORAL_CAPSULE | Freq: Every day | ORAL | 3 refills | Status: DC | PRN

## 2018-08-20 MED ORDER — POLYETHYLENE GLYCOL 3350 17 GM/SCOOP PO POWD: 17.0000 g | Freq: Two times a day (BID) | ORAL | 3 refills | Status: DC | PRN

## 2018-08-20 NOTE — Progress Notes (Deleted)
  Christian Mccann - 22 y.o. male MRN [id number]  Date of birth: [birth year]  SUBJECTIVE:  Including CC & ROS.  No chief complaint on file.   Christian Mccann is a 22 y.o. male that is  ***.  ***   Review of Systems  HISTORY: Past Medical, Surgical, Social, and Family History Reviewed & Updated per EMR.   Pertinent Historical Findings include:  Past Medical History:  Diagnosis Date  . Anxiety     No past surgical history on file.  No Known Allergies  Family History  Adopted: Yes     Social History   Socioeconomic History  . Marital status: Single    Spouse name: Not on file  . Number of children: Not on file  . Years of education: Not on file  . Highest education level: Not on file  Occupational History  . Not on file  Social Needs  . Financial resource strain: Not on file  . Food insecurity:    Worry: Not on file    Inability: Not on file  . Transportation needs:    Medical: Not on file    Non-medical: Not on file  Tobacco Use  . Smoking status: Never Smoker  . Smokeless tobacco: Never Used  Substance and Sexual Activity  . Alcohol use: Yes    Comment: unknown  . Drug use: Yes    Types: Marijuana  . Sexual activity: Yes    Birth control/protection: Condom  Lifestyle  . Physical activity:    Days per week: Not on file    Minutes per session: Not on file  . Stress: Not on file  Relationships  . Social connections:    Talks on phone: Not on file    Gets together: Not on file    Attends religious service: Not on file    Active member of club or organization: Not on file    Attends meetings of clubs or organizations: Not on file    Relationship status: Not on file  . Intimate partner violence:    Fear of current or ex partner: Not on file    Emotionally abused: Not on file    Physically abused: Not on file    Forced sexual activity: Not on file  Other Topics Concern  . Not on file  Social History Narrative  . Not on file     PHYSICAL  EXAM:  VS: There were no vitals taken for this visit. Physical Exam Gen: NAD, alert, cooperative with exam, well-appearing ENT: normal lips, normal nasal mucosa,  Eye: normal EOM, normal conjunctiva and lids CV:  no edema, +2 pedal pulses   Resp: no accessory muscle use, non-labored,  GI: no masses or tenderness, no hernia  Skin: no rashes, no areas of induration  Neuro: normal tone, normal sensation to touch Psych:  normal insight, alert and oriented MSK:  ***      ASSESSMENT & PLAN:   No problem-specific Assessment & Plan notes found for this encounter.   The above documentation has been reviewed and is accurate and complete. Clearance Coots, MD 08/20/2018, 8:55 AM>

## 2018-08-20 NOTE — Assessment & Plan Note (Addendum)
Pt stopped smoking 2-4 g of weed 1 week ago (court order): coping well so far

## 2018-08-20 NOTE — Assessment & Plan Note (Signed)
R wrist pain x 1 week after he punched the floor w/his fist in frustration X ray Spl[place]

## 2018-08-20 NOTE — Telephone Encounter (Signed)
appt made for today 

## 2018-08-20 NOTE — Assessment & Plan Note (Signed)
Pt stopped smoking 2-4 g of weed 1 week ago (court order) and developed constipation Linzess Miralax

## 2018-08-20 NOTE — Telephone Encounter (Signed)
Copied from [place] [id number]. 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  He has an appt with Dr. Raeford Razor but he would rather to see Dr. Alain Marion.  His mom wants to know if Dr. Alain Marion can be asked to work her son in today.

## 2018-08-20 NOTE — Progress Notes (Signed)
Subjective:  Patient ID: Christian Mccann, male    DOB: 04/20/1996  Age: 23 y.o. MRN: [id number]  CC: No chief complaint on file.   HPI Christian Mccann presents for R wrist pain x 1 week after he punched the floor w/his fist in frustration  C/o being constipated since last Thur after he stopped smoking 2-4 g of weed 1 week ago (court order)    Outpatient Medications Prior to Visit  Medication Sig Dispense Refill  . ADDERALL XR 20 MG 24 hr capsule   0  . PARoxetine (PAXIL) 20 MG tablet Take 1 tablet (20 mg total) by mouth daily. 30 tablet 3   No facility-administered medications prior to visit.     ROS: Review of Systems  Constitutional: Negative for appetite change, fatigue and unexpected weight change.  HENT: Negative for congestion, nosebleeds, sneezing, sore throat and trouble swallowing.   Eyes: Negative for itching and visual disturbance.  Respiratory: Negative for cough.   Cardiovascular: Negative for chest pain, palpitations and leg swelling.  Gastrointestinal: Positive for constipation. Negative for abdominal distention, abdominal pain, anal bleeding, blood in stool, diarrhea, nausea and rectal pain.  Genitourinary: Negative for frequency and hematuria.  Musculoskeletal: Positive for arthralgias. Negative for back pain, gait problem, joint swelling and neck pain.  Skin: Negative for rash.  Neurological: Negative for dizziness, tremors, speech difficulty and weakness.  Psychiatric/Behavioral: Negative for agitation, behavioral problems, confusion, decreased concentration, dysphoric mood, hallucinations, self-injury, sleep disturbance and suicidal ideas. The patient is not nervous/anxious and is not hyperactive.     Objective:  BP (!) 142/86 (BP Location: Left Arm, Patient Position: Sitting, Cuff Size: Normal)   Pulse 91   Temp 98.3 F (36.8 C) (Oral)   Ht 5\' 8"  (1.727 m)   Wt 204 lb (92.5 kg)   SpO2 98%   BMI 31.02 kg/m   BP Readings from Last 3  Encounters:  08/20/18 (!) 142/86  03/12/17 132/90  08/11/15 110/70    Wt Readings from Last 3 Encounters:  08/20/18 204 lb (92.5 kg)  03/12/17 219 lb (99.3 kg)  08/11/15 225 lb (102.1 kg)    Physical Exam  Constitutional: He is oriented to person, place, and time. He appears well-developed. No distress.  NAD  HENT:  Mouth/Throat: Oropharynx is clear and moist.  Eyes: Pupils are equal, round, and reactive to light. Conjunctivae are normal.  Neck: Normal range of motion. No JVD present. No thyromegaly present.  Cardiovascular: Normal rate, regular rhythm, normal heart sounds and intact distal pulses. Exam reveals no gallop and no friction rub.  No murmur heard. Pulmonary/Chest: Effort normal and breath sounds normal. No respiratory distress. He has no wheezes. He has no rales. He exhibits no tenderness.  Abdominal: Soft. Bowel sounds are normal. He exhibits no distension and no mass. There is no tenderness. There is no rebound and no guarding.  Musculoskeletal: Normal range of motion. He exhibits no edema or tenderness.  Lymphadenopathy:    He has no cervical adenopathy.  Neurological: He is alert and oriented to person, place, and time. He has normal reflexes. No cranial nerve deficit. He exhibits normal muscle tone. He displays a negative Romberg sign. Coordination and gait normal.  Skin: Skin is warm and dry. No rash noted.  Psychiatric: He has a normal mood and affect. His behavior is normal. Judgment and thought content normal.  No tremor, tachycardia R wrist - painful to palpation/ROM; no hematoma   Lab Results  Component Value Date  WBC 8.6 08/11/2015   HGB 17.4 (H) 08/11/2015   HCT 51.8 08/11/2015   PLT 283.0 08/11/2015   GLUCOSE 91 08/11/2015   CHOL 153 06/10/2014   TRIG 127.0 06/10/2014   HDL 54.50 06/10/2014   LDLCALC 73 06/10/2014   ALT 14 08/11/2015   AST 17 08/11/2015   NA 140 08/11/2015   K 4.4 08/11/2015   CL 103 08/11/2015   CREATININE 1.09 08/11/2015     BUN 13 08/11/2015   CO2 30 08/11/2015   TSH 1.58 06/10/2014    No results found.  Assessment & Plan:   There are no diagnoses linked to this encounter.   No orders of the defined types were placed in this encounter.    Follow-up: No follow-ups on file.  Walker Kehr, MD

## 2018-08-20 NOTE — Telephone Encounter (Signed)
I have spoken with patients mother.  She states that Christian Mccann has not broken his hand but that he is having some emotional issues and some other things he will not disclose.  States Christian Mccann will only see Dr. Alain Marion.  Is requesting Dr. Alain Marion to work Christian Mccann in today.

## 2018-08-25 ENCOUNTER — Telehealth: Payer: Self-pay | Admitting: Internal Medicine

## 2018-08-25 NOTE — Telephone Encounter (Signed)
Mailed results  

## 2018-08-25 NOTE — Telephone Encounter (Signed)
Copied from [place] [id number]. Topic: Quick Communication - See Telephone Encounter >> Aug 25, 2018 10:09 AM Antonieta Iba C wrote: CRM for notification. See Telephone encounter for: 08/25/18  Pt's mom is requesting that we call pt directly to give him his image results.    Phone # for pt: [phone]

## 2018-08-25 NOTE — Telephone Encounter (Signed)
Noted.  Please, mail the X ray report to the patient.  Thx

## 2018-08-25 NOTE — Telephone Encounter (Signed)
Patient called and I started the phone call off with hey Christian Mccann we tried to call you and he said that's not true and I said I guess your VM was full and that's why we called your mom and he said also untrue but that's not important, and I said ok and gave him the XRAY results and he asked is there an explanation as to why my wrist hurts and I cant pick anything up and I said it does not, I cant send a message to see what the next steps are either further testing or a referral and the patient hung up.

## 2018-08-25 NOTE — Telephone Encounter (Signed)
Please advise about possible next step?

## 2018-09-23 DIAGNOSIS — F902 Attention-deficit hyperactivity disorder, combined type: Secondary | ICD-10-CM | POA: Diagnosis not present

## 2018-09-23 DIAGNOSIS — Z79899 Other long term (current) drug therapy: Secondary | ICD-10-CM | POA: Diagnosis not present

## 2018-12-24 DIAGNOSIS — F902 Attention-deficit hyperactivity disorder, combined type: Secondary | ICD-10-CM | POA: Diagnosis not present

## 2018-12-24 DIAGNOSIS — Z79899 Other long term (current) drug therapy: Secondary | ICD-10-CM | POA: Diagnosis not present

## 2019-01-14 ENCOUNTER — Ambulatory Visit: Payer: Self-pay | Admitting: *Deleted

## 2019-01-14 NOTE — Telephone Encounter (Signed)
NO VM set up, will call back later to schedule visit

## 2019-01-14 NOTE — Telephone Encounter (Signed)
Pt and his mother, Kiet Geer, called stating that the pt has been in bed with stomach ache, diarrhea, and ? Fever since 01/08/2019; she says that they had been in [place] from November 2019 until 12/29/2018; she says that [place] a [place] and have come in contact with a large number  Asian people there; the pt had a cough when he came home and it continues; she says that he has not of the house since 01/08/2019; the pt prefers a virtual visit first; they can be contacted at [email or web address]; [phone], and a message can be left on the voicemail; spoke with Sam at [facility], and she states that Dr Alain Marion has to approve a virtual visit; she verbalized understanding; will route to office for notification.  Reason for Disposition . Patient sounds very sick or weak to the triager  Answer Assessment - Initial Assessment Questions 1. CONFIRMED CASE: "Who is the person with the confirmed COVID-19 infection that you were exposed to?"     Persons of suspicion at work 2. PLACE of CONTACT: "Where were you when you were exposed to COVID-19  (coronavirus disease 2019)?" (e.g., city, state, country)     [place] 3. TYPE of CONTACT: "How much contact was there?" (e.g., live in same house, work in same office, same school)   work 4. DATE of CONTACT: "When did you have contact with a coronavirus patient?" (e.g., days)     November 2019- March 06/2019 5. DURATION of CONTACT: "How long were you in contact with the COVID-19 (coronavirus disease) patient?" (e.g., a few seconds, passed by person, a few minutes, live with the patient)    days 6. SYMPTOMS: "Do you have any symptoms?" (e.g., fever, cough, breathing difficulty)    ? Fever, diarrhea; cough; pt has been in bed for 3 days 7. PREGNANCY OR POSTPARTUM: "Is there any chance you are pregnant?" "When was your last menstrual period?" "Did you deliver in the last 2 weeks?"   n/a 8. HIGH RISK: "Do you have any  heart or lung problems? Do you have a weakened immune system?" (e.g., CHF, COPD, asthma, HIV positive, chemotherapy, renal failure, diabetes mellitus, sickle cell anemia)     no  Protocols used: CORONAVIRUS (COVID-19) EXPOSURE-A-AH

## 2019-01-14 NOTE — Telephone Encounter (Signed)
Okay to schedule virtual ?

## 2019-01-15 NOTE — Telephone Encounter (Signed)
LVM to set virtual visit up.

## 2019-01-23 DIAGNOSIS — F609 Personality disorder, unspecified: Secondary | ICD-10-CM | POA: Diagnosis not present

## 2019-01-30 DIAGNOSIS — F609 Personality disorder, unspecified: Secondary | ICD-10-CM | POA: Diagnosis not present

## 2019-02-06 DIAGNOSIS — F609 Personality disorder, unspecified: Secondary | ICD-10-CM | POA: Diagnosis not present

## 2019-02-13 DIAGNOSIS — F609 Personality disorder, unspecified: Secondary | ICD-10-CM | POA: Diagnosis not present

## 2019-02-20 DIAGNOSIS — F609 Personality disorder, unspecified: Secondary | ICD-10-CM | POA: Diagnosis not present

## 2019-02-24 DIAGNOSIS — F4312 Post-traumatic stress disorder, chronic: Secondary | ICD-10-CM | POA: Diagnosis not present

## 2019-03-03 DIAGNOSIS — F4312 Post-traumatic stress disorder, chronic: Secondary | ICD-10-CM | POA: Diagnosis not present

## 2019-03-10 DIAGNOSIS — F4312 Post-traumatic stress disorder, chronic: Secondary | ICD-10-CM | POA: Diagnosis not present

## 2019-03-17 DIAGNOSIS — F4312 Post-traumatic stress disorder, chronic: Secondary | ICD-10-CM | POA: Diagnosis not present

## 2019-03-25 DIAGNOSIS — Z79899 Other long term (current) drug therapy: Secondary | ICD-10-CM | POA: Diagnosis not present

## 2019-03-25 DIAGNOSIS — F419 Anxiety disorder, unspecified: Secondary | ICD-10-CM | POA: Diagnosis not present

## 2019-03-25 DIAGNOSIS — F902 Attention-deficit hyperactivity disorder, combined type: Secondary | ICD-10-CM | POA: Diagnosis not present

## 2019-03-31 DIAGNOSIS — F4312 Post-traumatic stress disorder, chronic: Secondary | ICD-10-CM | POA: Diagnosis not present

## 2019-04-14 DIAGNOSIS — F4312 Post-traumatic stress disorder, chronic: Secondary | ICD-10-CM | POA: Diagnosis not present

## 2019-06-23 DIAGNOSIS — Z79899 Other long term (current) drug therapy: Secondary | ICD-10-CM | POA: Diagnosis not present

## 2019-06-23 DIAGNOSIS — F902 Attention-deficit hyperactivity disorder, combined type: Secondary | ICD-10-CM | POA: Diagnosis not present

## 2019-09-22 DIAGNOSIS — F902 Attention-deficit hyperactivity disorder, combined type: Secondary | ICD-10-CM | POA: Diagnosis not present

## 2019-09-22 DIAGNOSIS — Z79899 Other long term (current) drug therapy: Secondary | ICD-10-CM | POA: Diagnosis not present

## 2020-03-21 IMAGING — DX DG WRIST COMPLETE 3+V*R*
4 series · 4 of 4 positions shown · non-contrast
Comparison: None.

CLINICAL DATA: Wrist injury, pain

EXAM:
RIGHT WRIST - COMPLETE 3+ VIEW

[wrist pa]
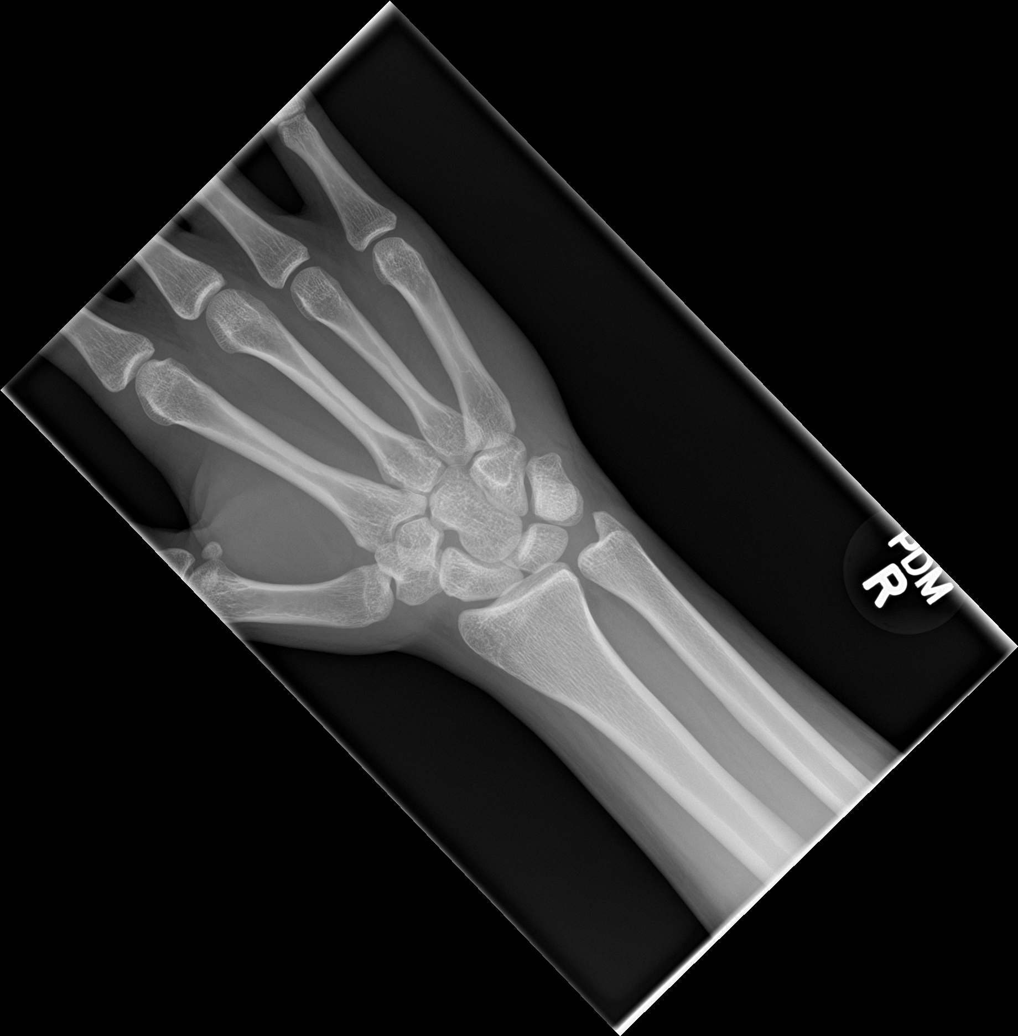

[wrist obl]
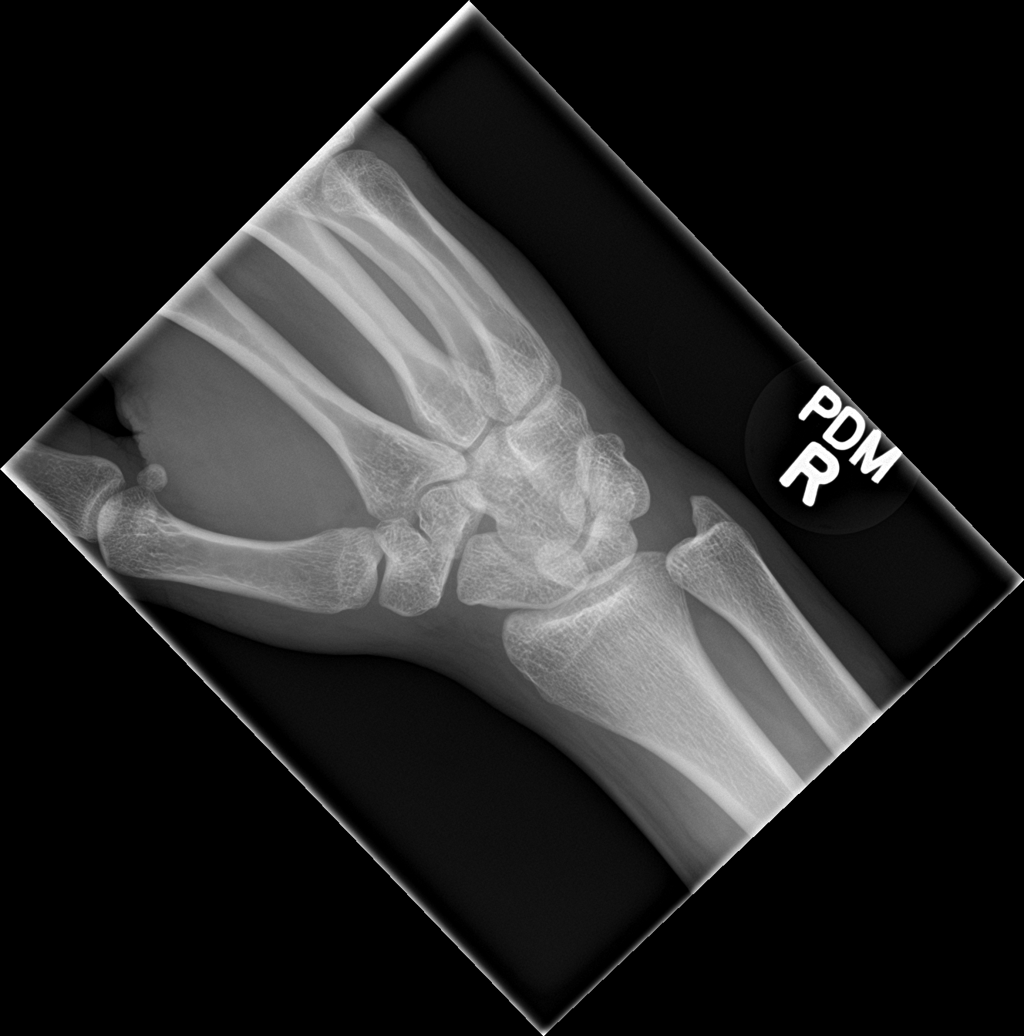

[wrist lat]
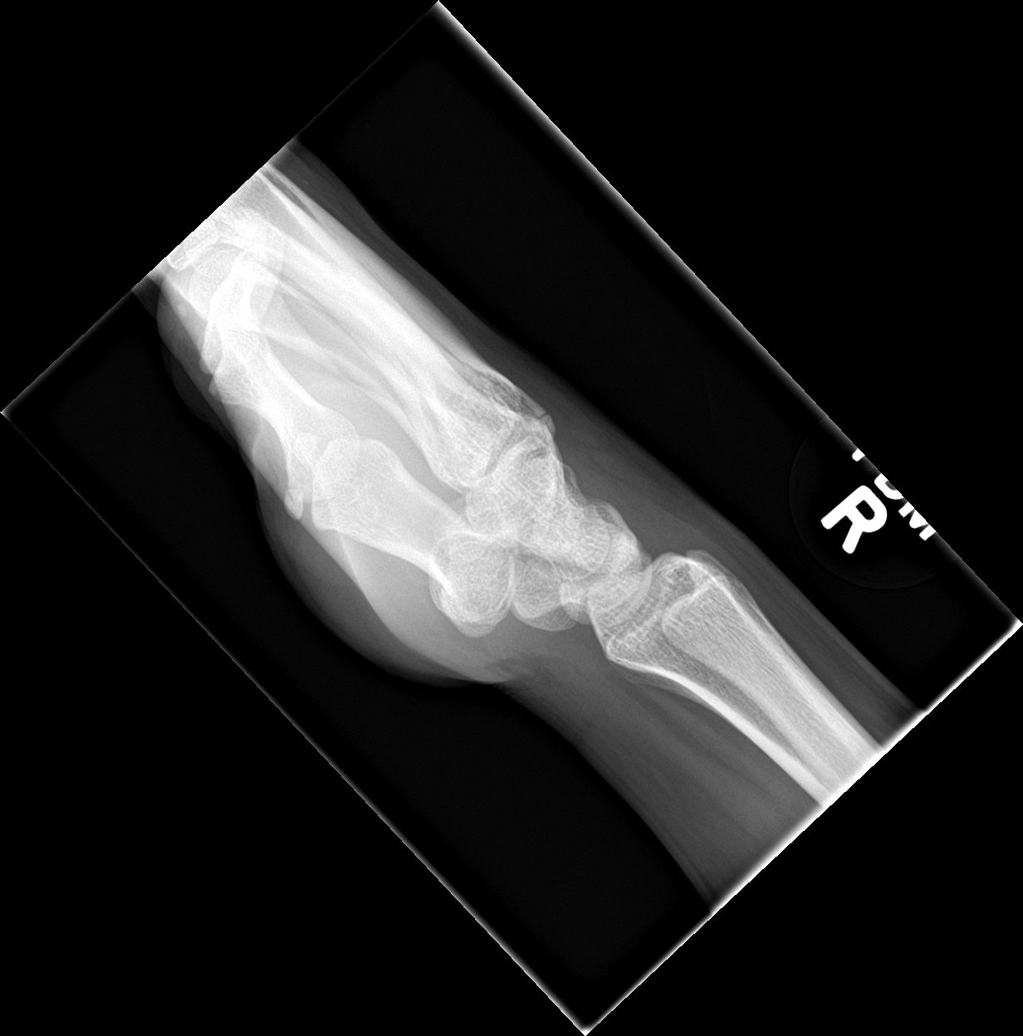

[navicular]
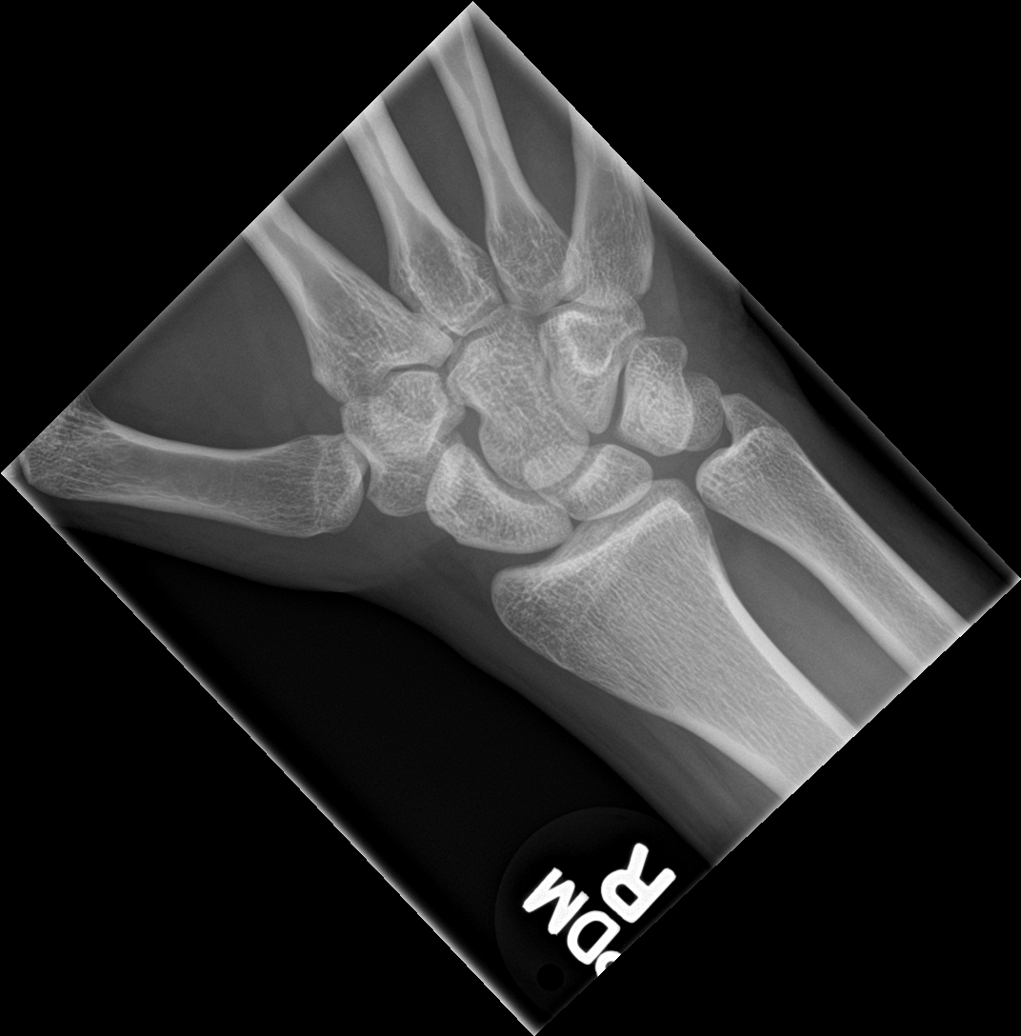

[4 of 4 positions shown; findings below may reference images not displayed]

FINDINGS: There is no evidence of fracture or dislocation. There is no
evidence of arthropathy or other focal bone abnormality. Soft
tissues are unremarkable.
IMPRESSION: No acute osseous finding

## 2020-05-31 ENCOUNTER — Other Ambulatory Visit: Payer: Self-pay

## 2020-05-31 ENCOUNTER — Ambulatory Visit (INDEPENDENT_AMBULATORY_CARE_PROVIDER_SITE_OTHER): Payer: 59 | Admitting: Internal Medicine

## 2020-05-31 ENCOUNTER — Encounter: Payer: Self-pay | Admitting: Internal Medicine

## 2020-05-31 VITALS — BP 136/92 | HR 95 | Temp 99.6°F | Ht 76.0 in | Wt 261.4 lb

## 2020-05-31 DIAGNOSIS — Z0000 Encounter for general adult medical examination without abnormal findings: Secondary | ICD-10-CM

## 2020-05-31 DIAGNOSIS — F988 Other specified behavioral and emotional disorders with onset usually occurring in childhood and adolescence: Secondary | ICD-10-CM | POA: Insufficient documentation

## 2020-05-31 MED ORDER — AMPHETAMINE-DEXTROAMPHET ER 20 MG PO CP24: 20.0000 mg | ORAL_CAPSULE | Freq: Every day | ORAL | 0 refills | Status: DC

## 2020-05-31 NOTE — Assessment & Plan Note (Signed)
Adderall XR 20 mg bid

## 2020-05-31 NOTE — Progress Notes (Signed)
Subjective:  Patient ID: Christian Mccann, male    DOB: 1995/12/10  Age: 24 y.o. MRN: [id number]  CC: No chief complaint on file.   HPI Christian Mccann presents for a well exam [facility] - prescribing Adderall XR 20 mg 2 a day (60 mg a day) 2 years, needs to have me to take over  C/o ankle pain  Not using pot x 8 months  Outpatient Medications Prior to Visit  Medication Sig Dispense Refill  . ADDERALL XR 20 MG 24 hr capsule   0  . linaclotide (LINZESS) 145 MCG CAPS capsule Take 1 capsule (145 mcg total) by mouth daily as needed (constipation). (Patient not taking: Reported on 05/31/2020) 30 capsule 3  . polyethylene glycol powder (GLYCOLAX/MIRALAX) powder Take 17 g by mouth 2 (two) times daily as needed for moderate constipation. (Patient not taking: Reported on 05/31/2020) 500 g 3   No facility-administered medications prior to visit.    ROS: Review of Systems  Constitutional: Negative for appetite change, fatigue and unexpected weight change.  HENT: Negative for congestion, nosebleeds, sneezing, sore throat and trouble swallowing.   Eyes: Negative for itching and visual disturbance.  Respiratory: Negative for cough.   Cardiovascular: Negative for chest pain, palpitations and leg swelling.  Gastrointestinal: Negative for abdominal distention, blood in stool, diarrhea and nausea.  Genitourinary: Negative for frequency and hematuria.  Musculoskeletal: Negative for back pain, gait problem, joint swelling and neck pain.  Skin: Negative for rash.  Neurological: Negative for dizziness, tremors, speech difficulty and weakness.  Psychiatric/Behavioral: Negative for agitation, dysphoric mood, sleep disturbance and suicidal ideas. The patient is not nervous/anxious.     Objective:  BP (!) 136/92 (BP Location: Left Arm, Patient Position: Sitting, Cuff Size: Normal)   Pulse 95   Temp 99.6 F (37.6 C) (Oral)   Ht 6\' 4"  (1.93 m)   Wt 261 lb 6.4 oz (118.6 kg)    SpO2 98%   BMI 31.82 kg/m   BP Readings from Last 3 Encounters:  05/31/20 (!) 136/92  08/20/18 (!) 142/86  03/12/17 132/90    Wt Readings from Last 3 Encounters:  05/31/20 261 lb 6.4 oz (118.6 kg)  08/20/18 204 lb (92.5 kg)  03/12/17 219 lb (99.3 kg)    Physical Exam Constitutional:      General: He is not in acute distress.    Appearance: He is well-developed.     Comments: NAD  Eyes:     Conjunctiva/sclera: Conjunctivae normal.     Pupils: Pupils are equal, round, and reactive to light.  Neck:     Thyroid: No thyromegaly.     Vascular: No JVD.  Cardiovascular:     Rate and Rhythm: Normal rate and regular rhythm.     Heart sounds: Normal heart sounds. No murmur heard.  No friction rub. No gallop.   Pulmonary:     Effort: Pulmonary effort is normal. No respiratory distress.     Breath sounds: Normal breath sounds. No wheezing or rales.  Chest:     Chest wall: No tenderness.  Abdominal:     General: Bowel sounds are normal. There is no distension.     Palpations: Abdomen is soft. There is no mass.     Tenderness: There is no abdominal tenderness. There is no guarding or rebound.  Musculoskeletal:        General: No tenderness. Normal range of motion.     Cervical back: Normal range of motion.  Lymphadenopathy:  Cervical: No cervical adenopathy.  Skin:    General: Skin is warm and dry.     Findings: No rash.  Neurological:     Mental Status: He is alert and oriented to person, place, and time.     Cranial Nerves: No cranial nerve deficit.     Motor: No abnormal muscle tone.     Coordination: Coordination normal.     Gait: Gait normal.     Deep Tendon Reflexes: Reflexes are normal and symmetric.  Psychiatric:        Behavior: Behavior normal.        Thought Content: Thought content normal.        Judgment: Judgment normal.     Lab Results  Component Value Date   WBC 8.6 08/11/2015   HGB 17.4 (H) 08/11/2015   HCT 51.8 08/11/2015   PLT 283.0  08/11/2015   GLUCOSE 91 08/11/2015   CHOL 153 06/10/2014   TRIG 127.0 06/10/2014   HDL 54.50 06/10/2014   LDLCALC 73 06/10/2014   ALT 14 08/11/2015   AST 17 08/11/2015   NA 140 08/11/2015   K 4.4 08/11/2015   CL 103 08/11/2015   CREATININE 1.09 08/11/2015   BUN 13 08/11/2015   CO2 30 08/11/2015   TSH 1.58 06/10/2014    DG Wrist Complete Right  Result Date: 08/21/2018 CLINICAL DATA:  Wrist injury, pain EXAM: RIGHT WRIST - COMPLETE 3+ VIEW COMPARISON:  None. FINDINGS: There is no evidence of fracture or dislocation. There is no evidence of arthropathy or other focal bone abnormality. Soft tissues are unremarkable. IMPRESSION: No acute osseous finding Electronically Signed   By: Jerilynn Mages.  Shick M.D.   On: 08/21/2018 08:57    Assessment & Plan:    Walker Kehr, MD

## 2020-06-09 ENCOUNTER — Telehealth (INDEPENDENT_AMBULATORY_CARE_PROVIDER_SITE_OTHER): Payer: Self-pay | Admitting: Podiatry

## 2020-06-09 ENCOUNTER — Other Ambulatory Visit: Payer: Self-pay

## 2020-06-14 NOTE — Progress Notes (Signed)
Virtual Visit via Video Note  I connected with Christian Mccann on 06/14/20 at  2:00 PM EDT by a video enabled telemedicine application and verified that I am speaking with the correct person using two identifiers.  Location: Patient: Home (mom is present- patient was not feeling well but in bed and helping to answer questions) Provider: Office   I discussed the limitations of evaluation and management by telemedicine and the availability of in person appointments. The patient expressed understanding and agreed to proceed.  History of Present Illness: 24 year old male no significant appointment to discuss possible surgical options for chronic foot pain.  He was told he has bone spurs and his mom wants to see if there is anything to be done to help remove the bone spurs as he is having ongoing pain on daily basis.  The patient was sick mostly in bed not able to fully answer questions and his mom that the most of the answering today.   Observations/Objective: Bilateral ankle pain noted but no specific area tenderness.  Unable to get full history.  Assessment and Plan: Chronic foot pain, ankle pain  Follow Up Instructions: I discussed possible surgical intervention with the bone spurs today however I am not able to evaluate this as I do not have any recent x-rays has been sometime since I saw him.  Discussed with the mother that we can have him come to the office for x-rays and further evaluation and treatment and she agrees to this plan.  All the office contact him for further treatment evaluation.   I discussed the assessment and treatment plan with the patient. The patient was provided an opportunity to ask questions and all were answered. The patient agreed with the plan and demonstrated an understanding of the instructions.   The patient was advised to call back or seek an in-person evaluation if the symptoms worsen or if the condition fails to improve as anticipated.  I provided 8  minutes of non-face-to-face time during this encounter. As I was not able to speak with the patient directly and this was a very limited exam I did not charge for today's visit.   Trula Slade, DPM

## 2020-06-16 ENCOUNTER — Ambulatory Visit: Payer: BLUE CROSS/BLUE SHIELD | Admitting: Podiatry

## 2020-06-17 ENCOUNTER — Telehealth: Payer: Self-pay | Admitting: Podiatry

## 2020-06-17 NOTE — Telephone Encounter (Signed)
Called lvm to call and schedule

## 2020-07-27 ENCOUNTER — Other Ambulatory Visit: Payer: Self-pay | Admitting: Internal Medicine

## 2020-07-27 MED ORDER — AMPHETAMINE-DEXTROAMPHET ER 20 MG PO CP24: 20.0000 mg | ORAL_CAPSULE | Freq: Every morning | ORAL | 0 refills | Status: DC

## 2020-08-31 ENCOUNTER — Encounter: Payer: Self-pay | Admitting: Internal Medicine

## 2020-08-31 ENCOUNTER — Other Ambulatory Visit: Payer: Self-pay

## 2020-08-31 ENCOUNTER — Ambulatory Visit (INDEPENDENT_AMBULATORY_CARE_PROVIDER_SITE_OTHER): Payer: BC Managed Care – PPO | Admitting: Internal Medicine

## 2020-08-31 DIAGNOSIS — F988 Other specified behavioral and emotional disorders with onset usually occurring in childhood and adolescence: Secondary | ICD-10-CM | POA: Diagnosis not present

## 2020-08-31 MED ORDER — AMPHETAMINE-DEXTROAMPHET ER 20 MG PO CP24: 20.0000 mg | ORAL_CAPSULE | Freq: Every morning | ORAL | 0 refills | Status: DC

## 2020-08-31 MED ORDER — AMPHETAMINE-DEXTROAMPHET ER 20 MG PO CP24: 40.0000 mg | ORAL_CAPSULE | Freq: Every day | ORAL | 0 refills | Status: DC

## 2020-08-31 NOTE — Progress Notes (Signed)
Subjective:  Patient ID: Christian Mccann, male    DOB: November 13, 1995  Age: 24 y.o. MRN: [id number]  CC: Follow-up (3 month F/U)   HPI Christian Mccann presents for ADD. He was on Adderal 40 mg q am  Outpatient Medications Prior to Visit  Medication Sig Dispense Refill  . amphetamine-dextroamphetamine (ADDERALL XR) 20 MG 24 hr capsule Take 1 capsule (20 mg total) by mouth in the morning. Take with lunch 30 capsule 0  . polyethylene glycol powder (GLYCOLAX/MIRALAX) powder Take 17 g by mouth 2 (two) times daily as needed for moderate constipation. (Patient not taking: Reported on 05/31/2020) 500 g 3   No facility-administered medications prior to visit.    ROS: Review of Systems  Constitutional: Negative for appetite change, fatigue and unexpected weight change.  HENT: Negative for congestion, nosebleeds, sneezing, sore throat and trouble swallowing.   Eyes: Negative for itching and visual disturbance.  Respiratory: Negative for cough.   Cardiovascular: Negative for chest pain, palpitations and leg swelling.  Gastrointestinal: Negative for abdominal distention, blood in stool, diarrhea and nausea.  Genitourinary: Negative for frequency and hematuria.  Musculoskeletal: Negative for back pain, gait problem, joint swelling and neck pain.  Skin: Negative for rash.  Neurological: Negative for dizziness, tremors, speech difficulty and weakness.  Psychiatric/Behavioral: Negative for agitation, dysphoric mood and sleep disturbance. The patient is not nervous/anxious.     Objective:  BP 110/76 (BP Location: Left Arm)   Pulse 70   Temp 98.9 F (37.2 C) (Oral)   Wt 254 lb 3.2 oz (115.3 kg)   SpO2 97%   BMI 30.94 kg/m   BP Readings from Last 3 Encounters:  08/31/20 110/76  05/31/20 (!) 136/92  08/20/18 (!) 142/86    Wt Readings from Last 3 Encounters:  08/31/20 254 lb 3.2 oz (115.3 kg)  05/31/20 261 lb 6.4 oz (118.6 kg)  08/20/18 204 lb (92.5 kg)    Physical  Exam Constitutional:      General: He is not in acute distress.    Appearance: He is well-developed.     Comments: NAD  Eyes:     Conjunctiva/sclera: Conjunctivae normal.     Pupils: Pupils are equal, round, and reactive to light.  Neck:     Thyroid: No thyromegaly.     Vascular: No JVD.  Cardiovascular:     Rate and Rhythm: Normal rate and regular rhythm.     Heart sounds: Normal heart sounds. No murmur heard.  No friction rub. No gallop.   Pulmonary:     Effort: Pulmonary effort is normal. No respiratory distress.     Breath sounds: Normal breath sounds. No wheezing or rales.  Chest:     Chest wall: No tenderness.  Abdominal:     General: Bowel sounds are normal. There is no distension.     Palpations: Abdomen is soft. There is no mass.     Tenderness: There is no abdominal tenderness. There is no guarding or rebound.  Musculoskeletal:        General: No tenderness. Normal range of motion.     Cervical back: Normal range of motion.  Lymphadenopathy:     Cervical: No cervical adenopathy.  Skin:    General: Skin is warm and dry.     Findings: No rash.  Neurological:     Mental Status: He is alert and oriented to person, place, and time.     Cranial Nerves: No cranial nerve deficit.     Motor: No abnormal  muscle tone.     Coordination: Coordination normal.     Gait: Gait normal.     Deep Tendon Reflexes: Reflexes are normal and symmetric.  Psychiatric:        Behavior: Behavior normal.        Thought Content: Thought content normal.        Judgment: Judgment normal.     Lab Results  Component Value Date   WBC 8.6 08/11/2015   HGB 17.4 (H) 08/11/2015   HCT 51.8 08/11/2015   PLT 283.0 08/11/2015   GLUCOSE 91 08/11/2015   CHOL 153 06/10/2014   TRIG 127.0 06/10/2014   HDL 54.50 06/10/2014   LDLCALC 73 06/10/2014   ALT 14 08/11/2015   AST 17 08/11/2015   NA 140 08/11/2015   K 4.4 08/11/2015   CL 103 08/11/2015   CREATININE 1.09 08/11/2015   BUN 13 08/11/2015    CO2 30 08/11/2015   TSH 1.58 06/10/2014    DG Wrist Complete Right  Result Date: 08/21/2018 CLINICAL DATA:  Wrist injury, pain EXAM: RIGHT WRIST - COMPLETE 3+ VIEW COMPARISON:  None. FINDINGS: There is no evidence of fracture or dislocation. There is no evidence of arthropathy or other focal bone abnormality. Soft tissues are unremarkable. IMPRESSION: No acute osseous finding Electronically Signed   By: Jerilynn Mages.  Shick M.D.   On: 08/21/2018 08:57    Assessment & Plan:   Walker Kehr, MD

## 2020-09-04 ENCOUNTER — Encounter: Payer: Self-pay | Admitting: Internal Medicine

## 2020-09-04 NOTE — Assessment & Plan Note (Signed)
Adderall XR 20 mg bid - am and lunch

## 2020-10-14 DIAGNOSIS — Z20822 Contact with and (suspected) exposure to covid-19: Secondary | ICD-10-CM | POA: Diagnosis not present

## 2020-11-14 ENCOUNTER — Telehealth: Payer: Self-pay | Admitting: Internal Medicine

## 2020-11-14 NOTE — Telephone Encounter (Signed)
The pharmacy has 2 different prescriptions for Adderall.  Clarification needed & adjustment of quantity may also be needed if pt is to take BID.  Please send to pharmacy requested in this call.

## 2020-11-14 NOTE — Telephone Encounter (Signed)
amphetamine-dextroamphetamine (ADDERALL XR) 20 MG 24 hr capsule [place], [place] Phone:  [phone]  Fax:  [phone]     The pharmacy only filled 30 the last time he went to pick it up because they said that's all insurance will allow, even though 60 was sent in. Patients mother wondering what we need to do to get this refilled because the patient is out of medication

## 2020-11-15 ENCOUNTER — Other Ambulatory Visit: Payer: Self-pay | Admitting: Internal Medicine

## 2020-11-15 MED ORDER — AMPHETAMINE-DEXTROAMPHET ER 20 MG PO CP24: 40.0000 mg | ORAL_CAPSULE | Freq: Every morning | ORAL | 0 refills | Status: DC

## 2020-11-15 NOTE — Telephone Encounter (Signed)
Rec'd PA for Adderral XR 20 mg completed via cover-my-meds w/ Key: [id number] - Rx #: [id number]. Waiting on approval status.Marland KitchenJohny Chess

## 2020-11-15 NOTE — Addendum Note (Signed)
Addended by: Cassandria Anger on: 11/15/2020 07:37 AM   Modules accepted: Orders

## 2020-11-15 NOTE — Telephone Encounter (Signed)
Please cancel the prescription at [facility] November for February. I emailed Rx to [facility] needs an office visit with me every 3 months. Thanks

## 2020-11-15 NOTE — Telephone Encounter (Signed)
Notified pt mother MD sent new rx to [facility]. She stated that the pharmacy just called her and they stated they sent over a fax for the MD to complete. Inform mom sounds like he is needing a prior auth. Once we receive we will contact insurance to complete.Marland KitchenJohny Mccann

## 2020-11-17 NOTE — Telephone Encounter (Signed)
Check status on PA via cover-my-meds. Med has been Approved. Effective from 11/15/2020 through 11/14/2021.../l;mb

## 2020-12-01 ENCOUNTER — Ambulatory Visit: Payer: BC Managed Care – PPO | Admitting: Internal Medicine

## 2020-12-01 DIAGNOSIS — Z0289 Encounter for other administrative examinations: Secondary | ICD-10-CM

## 2020-12-06 NOTE — Telephone Encounter (Signed)
Patients mother called and had questions in regards to the PA for amphetamine-dextroamphetamine (ADDERALL XR) 20 MG 24 hr capsule. She can be reached at [phone].

## 2020-12-07 NOTE — Telephone Encounter (Signed)
Mother retuen call  Back she states she think the problem is taking care of since the PA been approve. Son was only getting 30 tab when he take 2 a day. She states she paid out-of-pocket for 30 tab ad just want to make sure when he need the refill in 9 days he is able to get #60. I inform mother per chart PA was approved thru next year so he should received #v 60.Christian KitchenJohny Chess

## 2020-12-07 NOTE — Telephone Encounter (Signed)
Called mother there was no answer LMOM RTC..,Christian Mccann

## 2020-12-21 NOTE — Telephone Encounter (Signed)
mother calling, states the pharmacy is now saying that they dont have a valid prescription for the medication

## 2020-12-23 ENCOUNTER — Other Ambulatory Visit: Payer: Self-pay | Admitting: Internal Medicine

## 2020-12-23 MED ORDER — AMPHETAMINE-DEXTROAMPHET ER 20 MG PO CP24: 40.0000 mg | ORAL_CAPSULE | Freq: Every day | ORAL | 0 refills | Status: DC

## 2020-12-23 NOTE — Telephone Encounter (Signed)
I told Christian Mccann that he can do a virtual appointment every other time. I will renew his prescription.  He will need to schedule a virtual office visit for March.  Thanks

## 2020-12-23 NOTE — Telephone Encounter (Signed)
Notified pt mom w/MD response. She states son is 5 hours away in school [employer]. Wanting to know if he can do a virtual appt, and if he need to have labs/urine done she will pay to have labs done and have results fax to MD. She states he will be back home for the summer but until then can virtual do.Marland KitchenJohny Chess

## 2020-12-23 NOTE — Telephone Encounter (Signed)
Notified pt mother w/MD response. Virtual appt made for 12/29/20 @ 8:30.Marland KitchenJohny Mccann

## 2020-12-23 NOTE — Telephone Encounter (Signed)
Adderall is a controlled substance.  Christian Mccann has to see me every 3 months to maintain his Adderall prescription. He was a no-show for 12/01/2020 appointment.  He was also supposed to do blood work and a urine test that he has never done. His four recent prescriptions were written for 60 tablets. Thanks,

## 2020-12-26 ENCOUNTER — Ambulatory Visit: Payer: BC Managed Care – PPO | Admitting: Internal Medicine

## 2020-12-29 ENCOUNTER — Telehealth (INDEPENDENT_AMBULATORY_CARE_PROVIDER_SITE_OTHER): Payer: BC Managed Care – PPO | Admitting: Internal Medicine

## 2020-12-29 ENCOUNTER — Encounter: Payer: Self-pay | Admitting: Internal Medicine

## 2020-12-29 DIAGNOSIS — F988 Other specified behavioral and emotional disorders with onset usually occurring in childhood and adolescence: Secondary | ICD-10-CM

## 2020-12-29 MED ORDER — AMPHETAMINE-DEXTROAMPHET ER 20 MG PO CP24: 40.0000 mg | ORAL_CAPSULE | Freq: Every day | ORAL | 0 refills | Status: DC

## 2020-12-29 MED ORDER — AMPHETAMINE-DEXTROAMPHET ER 20 MG PO CP24: 40.0000 mg | ORAL_CAPSULE | Freq: Every morning | ORAL | 0 refills | Status: DC

## 2020-12-29 NOTE — Progress Notes (Signed)
Virtual Visit via Video Note  I connected with Christian Mccann on 12/29/20 at  8:30 AM EST by a video enabled telemedicine application and verified that I am speaking with the correct person using two identifiers.   I discussed the limitations of evaluation and management by telemedicine and the availability of in person appointments. The patient expressed understanding and agreed to proceed.  I was located at our [facility] office. The patient was at home. There was no one else present in the visit.   History of Present Illness: We need to follow-up on ADD  There has been no chest pain, shortness of breath, abdominal pain. Doing well emotionally. 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  Observations/Objective: The patient appears to be in no acute distress, looks well.  Assessment and Plan:  See my Assessment and Plan. Follow Up Instructions:    I discussed the assessment and treatment plan with the patient. The patient was provided an opportunity to ask questions and all were answered. The patient agreed with the plan and demonstrated an understanding of the instructions.   The patient was advised to call back or seek an in-person evaluation if the symptoms worsen or if the condition fails to improve as anticipated.  I provided face-to-face time during this encounter. We were at different locations.   Walker Kehr, MD

## 2020-12-29 NOTE — Assessment & Plan Note (Signed)
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  Re-new Adderall RTC 3 mo

## 2021-05-12 ENCOUNTER — Other Ambulatory Visit: Payer: Self-pay

## 2021-05-12 NOTE — Telephone Encounter (Signed)
Adderall '40mg'$   Last Visit: 08/31/20 Next Visit: none Last Filled: 08/31/20 per PMP  PMP done; please advise

## 2021-05-16 NOTE — Telephone Encounter (Signed)
Christian Mccann needs follow-up appointments every 3 months

## 2021-06-07 ENCOUNTER — Telehealth: Payer: BC Managed Care – PPO | Admitting: Physician Assistant

## 2021-06-08 NOTE — Progress Notes (Signed)
Erroneous encounter

## 2021-06-13 ENCOUNTER — Telehealth (INDEPENDENT_AMBULATORY_CARE_PROVIDER_SITE_OTHER): Payer: BLUE CROSS/BLUE SHIELD | Admitting: Internal Medicine

## 2021-06-13 ENCOUNTER — Encounter: Payer: Self-pay | Admitting: Internal Medicine

## 2021-06-13 DIAGNOSIS — F988 Other specified behavioral and emotional disorders with onset usually occurring in childhood and adolescence: Secondary | ICD-10-CM

## 2021-06-13 MED ORDER — AMPHETAMINE-DEXTROAMPHET ER 20 MG PO CP24: 40.0000 mg | ORAL_CAPSULE | Freq: Every day | ORAL | 0 refills | Status: DC

## 2021-06-13 MED ORDER — AMPHETAMINE-DEXTROAMPHET ER 20 MG PO CP24: 40.0000 mg | ORAL_CAPSULE | Freq: Every morning | ORAL | 0 refills | Status: DC

## 2021-06-13 NOTE — Progress Notes (Signed)
Virtual Visit via Video Note  I connected with Christian Mccann on 06/13/21 at  4:00 PM EDT by a video enabled telemedicine application and verified that I am speaking with the correct person using two identifiers.   I discussed the limitations of evaluation and management by telemedicine and the availability of in person appointments. The patient expressed understanding and agreed to proceed.  I was located at our [facility] office. The patient was at home. There was no one else present in the visit.   History of Present Illness: We need to follow-up on ADD.  Christian Mccann says he is doing well.  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  He is in [place].    Observations/Objective: The patient appears to be in no acute distress.  Assessment and Plan:  See my Assessment and Plan. Follow Up Instructions:    I discussed the assessment and treatment plan with the patient. The patient was provided an opportunity to ask questions and all were answered. The patient agreed with the plan and demonstrated an understanding of the instructions.   The patient was advised to call back or seek an in-person evaluation if the symptoms worsen or if the condition fails to improve as anticipated.  I provided face-to-face time during this encounter. We were at different locations.   Walker Kehr, MD

## 2021-06-13 NOTE — Assessment & Plan Note (Signed)
Continue with Adderall XR 20 mg bid - am and lunch.  Alternatively, he can take 2 in the morning.    Potential benefits of a long term amphetamines  use as well as potential risks  and complications were explained to the patient and were aknowledged. ROV q 3 months virtual or face-to-face 

## 2021-09-21 ENCOUNTER — Telehealth (INDEPENDENT_AMBULATORY_CARE_PROVIDER_SITE_OTHER): Payer: BLUE CROSS/BLUE SHIELD | Admitting: Internal Medicine

## 2021-09-21 ENCOUNTER — Encounter: Payer: Self-pay | Admitting: Internal Medicine

## 2021-09-21 ENCOUNTER — Other Ambulatory Visit: Payer: Self-pay

## 2021-09-21 DIAGNOSIS — S93409S Sprain of unspecified ligament of unspecified ankle, sequela: Secondary | ICD-10-CM

## 2021-09-21 DIAGNOSIS — M25579 Pain in unspecified ankle and joints of unspecified foot: Secondary | ICD-10-CM | POA: Diagnosis not present

## 2021-09-21 DIAGNOSIS — F988 Other specified behavioral and emotional disorders with onset usually occurring in childhood and adolescence: Secondary | ICD-10-CM | POA: Diagnosis not present

## 2021-09-21 MED ORDER — AMPHETAMINE-DEXTROAMPHET ER 20 MG PO CP24: 40.0000 mg | ORAL_CAPSULE | Freq: Every day | ORAL | 0 refills | Status: DC

## 2021-09-21 MED ORDER — AMPHETAMINE-DEXTROAMPHET ER 20 MG PO CP24: 40.0000 mg | ORAL_CAPSULE | Freq: Every morning | ORAL | 0 refills | Status: DC

## 2021-09-21 NOTE — Assessment & Plan Note (Signed)
Continue with Adderall XR 20 mg bid - am and lunch.  Alternatively, he can take 2 in the morning.    Potential benefits of a long term amphetamines  use as well as potential risks  and complications were explained to the patient and were aknowledged. ROV in 3 months face-to-face

## 2021-09-21 NOTE — Progress Notes (Signed)
Virtual Visit via Video Note  I connected with Christian Mccann on 09/21/21 at 10:40 AM EST by a video enabled telemedicine application and verified that I am speaking with the correct person using two identifiers.   I discussed the limitations of evaluation and management by telemedicine and the availability of in person appointments. The patient expressed understanding and agreed to proceed.  I was located at our [facility] office. The patient was at home. There was no one else present in the visit.  No chief complaint on file.    History of Present Illness:  F/u on ADD. Doing well. Working for [employer] lately. C/o ankle pain  Review of Systems  Cardiovascular:  Negative for chest pain and leg swelling.  Musculoskeletal:  Positive for joint pain.  Psychiatric/Behavioral:  Negative for depression, memory loss, substance abuse and suicidal ideas. The patient is not nervous/anxious and does not have insomnia.    Ankle pain, chronic  Observations/Objective: The patient appears to be in no acute distress  Assessment and Plan:  Problem List Items Addressed This Visit     ADD (attention deficit disorder)    Continue with Adderall XR 20 mg bid - am and lunch.  Alternatively, he can take 2 in the morning.    Potential benefits of a long term amphetamines  use as well as potential risks  and complications were explained to the patient and were aknowledged. ROV in 3 months face-to-face      Sprain of ankle    Recurrent - skiing        Meds ordered this encounter  Medications   DISCONTD: amphetamine-dextroamphetamine (ADDERALL XR) 20 MG 24 hr capsule    Sig: Take 2 capsules (40 mg total) by mouth daily. Take with lunch    Dispense:  60 capsule    Refill:  0    Please fill on or after 09/21/21   DISCONTD: amphetamine-dextroamphetamine (ADDERALL XR) 20 MG 24 hr capsule    Sig: Take 2 capsules (40 mg total) by mouth in the morning. Please fill on or after 10/21/21     Dispense:  60 capsule    Refill:  0   DISCONTD: amphetamine-dextroamphetamine (ADDERALL XR) 20 MG 24 hr capsule    Sig: Take 2 capsules (40 mg total) by mouth daily. Take with breakfast    Dispense:  60 capsule    Refill:  0    Please fill on or after 11/20/21   amphetamine-dextroamphetamine (ADDERALL XR) 20 MG 24 hr capsule    Sig: Take 2 capsules (40 mg total) by mouth daily. Take with lunch    Dispense:  60 capsule    Refill:  0    Please fill on or after 09/21/21   amphetamine-dextroamphetamine (ADDERALL XR) 20 MG 24 hr capsule    Sig: Take 2 capsules (40 mg total) by mouth in the morning. Please fill on or after 10/21/21    Dispense:  60 capsule    Refill:  0   amphetamine-dextroamphetamine (ADDERALL XR) 20 MG 24 hr capsule    Sig: Take 2 capsules (40 mg total) by mouth daily. Take with breakfast    Dispense:  60 capsule    Refill:  0    Please fill on or after 11/20/21      Follow Up Instructions:    I discussed the assessment and treatment plan with the patient. The patient was provided an opportunity to ask questions and all were answered. The patient agreed with the  plan and demonstrated an understanding of the instructions.   The patient was advised to call back or seek an in-person evaluation if the symptoms worsen or if the condition fails to improve as anticipated.  I provided face-to-face time during this encounter. We were at different locations.   Walker Kehr, MD

## 2021-09-21 NOTE — Assessment & Plan Note (Addendum)
Recurrent - skiing. Worse  Use a sleeve brace

## 2021-12-07 ENCOUNTER — Telehealth: Payer: Self-pay | Admitting: Internal Medicine

## 2021-12-07 NOTE — Telephone Encounter (Signed)
1.Medication Requested: amphetamine-dextroamphetamine (ADDERALL XR) 20 MG 24 hr capsule  2. Pharmacy (Name, [place], [facility]): [place], [place]  Phone:  [phone] Fax:  [phone]   3. On Med List: yes  4. Last Visit with PCP: 12.01.22  5. Next visit date with PCP: n/a  **Patient mother says provider or nurse will need to contact insurance to give permission to fill for 60 quantity as it is usually denied bc they do not cover** Patient currently out of med   Agent: Please be advised that RX refills may take up to 3 business days. We ask that you follow-up with your pharmacy.

## 2021-12-08 MED ORDER — AMPHETAMINE-DEXTROAMPHET ER 20 MG PO CP24: 40.0000 mg | ORAL_CAPSULE | Freq: Every morning | ORAL | 0 refills | Status: DC

## 2021-12-08 NOTE — Telephone Encounter (Signed)
Okay.  Done.  Fill on or after 12/19/21 Thanks

## 2021-12-20 ENCOUNTER — Telehealth: Payer: Self-pay

## 2021-12-20 NOTE — Telephone Encounter (Signed)
Pt mother is calling to set up an appt and also informed me that she needed to e-mail some forms that need to be filled out by provider.  ? ?Forms will be printed and placed in provider box  ?

## 2021-12-21 NOTE — Telephone Encounter (Signed)
Email was never received ? ?

## 2022-01-02 ENCOUNTER — Other Ambulatory Visit: Payer: Self-pay

## 2022-01-02 ENCOUNTER — Ambulatory Visit (INDEPENDENT_AMBULATORY_CARE_PROVIDER_SITE_OTHER): Payer: Managed Care, Other (non HMO) | Admitting: Internal Medicine

## 2022-01-02 ENCOUNTER — Ambulatory Visit: Payer: Managed Care, Other (non HMO) | Admitting: Podiatry

## 2022-01-02 ENCOUNTER — Encounter: Payer: Self-pay | Admitting: Internal Medicine

## 2022-01-02 ENCOUNTER — Other Ambulatory Visit: Payer: Self-pay | Admitting: Podiatry

## 2022-01-02 ENCOUNTER — Ambulatory Visit (INDEPENDENT_AMBULATORY_CARE_PROVIDER_SITE_OTHER): Payer: Managed Care, Other (non HMO)

## 2022-01-02 VITALS — BP 120/76 | HR 67 | Temp 98.3°F | Ht 76.0 in | Wt 270.4 lb

## 2022-01-02 DIAGNOSIS — Z23 Encounter for immunization: Secondary | ICD-10-CM

## 2022-01-02 DIAGNOSIS — M779 Enthesopathy, unspecified: Secondary | ICD-10-CM

## 2022-01-02 DIAGNOSIS — M7751 Other enthesopathy of right foot: Secondary | ICD-10-CM

## 2022-01-02 DIAGNOSIS — Z0000 Encounter for general adult medical examination without abnormal findings: Secondary | ICD-10-CM | POA: Diagnosis not present

## 2022-01-02 MED ORDER — MELOXICAM 15 MG PO TABS: 15.0000 mg | ORAL_TABLET | Freq: Every day | ORAL | 0 refills | Status: AC | PRN

## 2022-01-02 NOTE — Progress Notes (Signed)
? ?Subjective:  ?Patient ID: Christian Mccann, male    DOB: 30-Dec-1995  Age: 26 y.o. MRN: [id number] ? ?CC: Annual Exam (No concerns) ? ? ?HPI ?Christian Mccann presents for a well exam ? ?Going back to [employer].  Christian Mccann will need form for college filled out.  His shots are up-to-date ? ?R ankle pain - MRI is pending; Dr Jacqualyn Posey ? ?Outpatient Medications Prior to Visit  ?Medication Sig Dispense Refill  ? amphetamine-dextroamphetamine (ADDERALL XR) 20 MG 24 hr capsule Take 2 capsules (40 mg total) by mouth daily. Take with lunch 60 capsule 0  ? amphetamine-dextroamphetamine (ADDERALL XR) 20 MG 24 hr capsule Take 2 capsules (40 mg total) by mouth daily. Take with breakfast 60 capsule 0  ? amphetamine-dextroamphetamine (ADDERALL XR) 20 MG 24 hr capsule Take 2 capsules (40 mg total) by mouth in the morning. Please fill on or after 12/19/21 60 capsule 0  ? meloxicam (MOBIC) 15 MG tablet Take 1 tablet (15 mg total) by mouth daily as needed for pain. (Patient not taking: Reported on 01/02/2022) 30 tablet 0  ? ?No facility-administered medications prior to visit.  ? ? ?ROS: ?Review of Systems  ?Constitutional:  Negative for appetite change, fatigue and unexpected weight change.  ?HENT:  Negative for congestion, nosebleeds, sneezing, sore throat and trouble swallowing.   ?Eyes:  Negative for itching and visual disturbance.  ?Respiratory:  Negative for cough.   ?Cardiovascular:  Negative for chest pain, palpitations and leg swelling.  ?Gastrointestinal:  Negative for abdominal distention, blood in stool, diarrhea and nausea.  ?Genitourinary:  Negative for frequency and hematuria.  ?Musculoskeletal:  Negative for back pain, gait problem, joint swelling and neck pain.  ?Skin:  Negative for rash.  ?Neurological:  Negative for dizziness, tremors, speech difficulty and weakness.  ?Psychiatric/Behavioral:  Positive for decreased concentration. Negative for agitation, dysphoric mood and sleep disturbance. The patient is not  nervous/anxious.   ? ?Objective:  ?BP 120/76   Pulse 67   Temp 98.3 ?F (36.8 ?C) (Oral)   Ht '6\' 4"'$  (1.93 m)   Wt 270 lb 6 oz (122.6 kg)   SpO2 97%   BMI 32.91 kg/m?  ? ?BP Readings from Last 3 Encounters:  ?01/02/22 120/76  ?08/31/20 110/76  ?05/31/20 (!) 136/92  ? ? ?Wt Readings from Last 3 Encounters:  ?01/02/22 270 lb 6 oz (122.6 kg)  ?08/31/20 254 lb 3.2 oz (115.3 kg)  ?05/31/20 261 lb 6.4 oz (118.6 kg)  ? ? ?Physical Exam ?Constitutional:   ?   General: He is not in acute distress. ?   Appearance: Normal appearance. He is well-developed. He is not diaphoretic.  ?   Comments: NAD  ?HENT:  ?   Head: Normocephalic and atraumatic.  ?   Right Ear: External ear normal.  ?   Left Ear: External ear normal.  ?   Nose: Nose normal.  ?   Mouth/Throat:  ?   Pharynx: No oropharyngeal exudate.  ?Eyes:  ?   General: No scleral icterus.    ?   Right eye: No discharge.     ?   Left eye: No discharge.  ?   Conjunctiva/sclera: Conjunctivae normal.  ?   Pupils: Pupils are equal, round, and reactive to light.  ?Neck:  ?   Thyroid: No thyromegaly.  ?   Vascular: No JVD.  ?   Trachea: No tracheal deviation.  ?Cardiovascular:  ?   Rate and Rhythm: Normal rate and regular rhythm.  ?  Heart sounds: Normal heart sounds. No murmur heard. ?  No friction rub. No gallop.  ?Pulmonary:  ?   Effort: Pulmonary effort is normal. No respiratory distress.  ?   Breath sounds: Normal breath sounds. No stridor. No wheezing or rales.  ?Chest:  ?   Chest wall: No tenderness.  ?Abdominal:  ?   General: Bowel sounds are normal. There is no distension.  ?   Palpations: Abdomen is soft. There is no mass.  ?   Tenderness: There is no abdominal tenderness. There is no guarding or rebound.  ?Genitourinary: ?   Penis: Normal. No tenderness.   ?   Prostate: Normal.  ?   Rectum: Normal. Guaiac result negative.  ?Musculoskeletal:     ?   General: No tenderness. Normal range of motion.  ?   Cervical back: Normal range of motion and neck supple.   ?Lymphadenopathy:  ?   Cervical: No cervical adenopathy.  ?Skin: ?   General: Skin is warm and dry.  ?   Coloration: Skin is not pale.  ?   Findings: No erythema or rash.  ?Neurological:  ?   Mental Status: He is alert and oriented to person, place, and time.  ?   Cranial Nerves: No cranial nerve deficit.  ?   Motor: No abnormal muscle tone.  ?   Coordination: Coordination normal.  ?   Gait: Gait normal.  ?   Deep Tendon Reflexes: Reflexes are normal and symmetric. Reflexes normal.  ?Psychiatric:     ?   Behavior: Behavior normal.     ?   Thought Content: Thought content normal.     ?   Judgment: Judgment normal.  ? ? ?Lab Results  ?Component Value Date  ? WBC 8.6 08/11/2015  ? HGB 17.4 (H) 08/11/2015  ? HCT 51.8 08/11/2015  ? PLT 283.0 08/11/2015  ? GLUCOSE 91 08/11/2015  ? CHOL 153 06/10/2014  ? TRIG 127.0 06/10/2014  ? HDL 54.50 06/10/2014  ? Tillar 73 06/10/2014  ? ALT 14 08/11/2015  ? AST 17 08/11/2015  ? NA 140 08/11/2015  ? K 4.4 08/11/2015  ? CL 103 08/11/2015  ? CREATININE 1.09 08/11/2015  ? BUN 13 08/11/2015  ? CO2 30 08/11/2015  ? TSH 1.58 06/10/2014  ? ? ?DG Wrist Complete Right ? ?Result Date: 08/21/2018 ?CLINICAL DATA:  Wrist injury, pain EXAM: RIGHT WRIST - COMPLETE 3+ VIEW COMPARISON:  None. FINDINGS: There is no evidence of fracture or dislocation. There is no evidence of arthropathy or other focal bone abnormality. Soft tissues are unremarkable. IMPRESSION: No acute osseous finding Electronically Signed   By: Jerilynn Mages.  Shick M.D.   On: 08/21/2018 08:57  ? ? ?Assessment & Plan:  ? ?Problem List Items Addressed This Visit   ? ? Well adult exam - Primary  ?  Christian Mccann is going back to [employer].  Christian Mccann will need form for college filled out.  His shots are up-to-date: Tdap was updated ? ? ?We discussed age appropriate health related issues, including available/recomended screening tests and vaccinations. Labs were ordered to be later reviewed . All questions were answered. We discussed one or more of the  following - seat belt use, use of sunscreen/sun exposure exercise, fall risk reduction, second hand smoke exposure, firearm use and storage, seat belt use, a need for adhering to healthy diet and exercise. ?Labs were ordered.  All questions were answered. ? ? ?  ?  ? Relevant Orders  ? Urinalysis  ? CBC  with Differential/Platelet  ? TSH  ? Lipid panel  ? Comprehensive metabolic panel  ? ?Other Visit Diagnoses   ? ? Need for vaccination      ? Relevant Orders  ? Tdap vaccine greater than or equal to 7yo IM (Completed)  ? ?  ?  ? ? ?No orders of the defined types were placed in this encounter. ?  ? ? ?Follow-up: No follow-ups on file. ? ?Walker Kehr, MD ?

## 2022-01-03 ENCOUNTER — Telehealth: Payer: Self-pay

## 2022-01-03 NOTE — Telephone Encounter (Signed)
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 Pt e-mail the forms to be printed and placed in box.  ? ?Please e-mail the completed form to [email or web address] and mail the completed form ?[place],  ?Attn: Health Information Management, Gerda Diss. Narda Bonds., CB# [phone],  ?[place], [place] [address]  ? ?Please update pt when forms have been sent [phone] ? ?I advised the mother to Please allow 7-10 business days for the completion of form. ? ?

## 2022-01-05 NOTE — Progress Notes (Signed)
Subjective:  ? ?Patient ID: Christian Mccann, male   DOB: 26 y.o.   MRN: [id number]  ? ?HPI ?26 year old male presents the office today for concerns of ankle pain which is been ongoing about last 8 years.  He points along the lateral aspect the ankle where he gets majority discomfort.  He reports a bone spur.  He has a sharp pain with walking.  No swelling.  He states that elevation helps.  He states he gets pain after he is standing up and if he states it feels better.  No recent injuries that he reports.  He is he has no other concerns. ? ? ?Review of Systems  ?All other systems reviewed and are negative. ? ?Past Medical History:  ?Diagnosis Date  ? Anxiety   ? ? ?No past surgical history on file. ? ? ?Current Outpatient Medications:  ?  meloxicam (MOBIC) 15 MG tablet, Take 1 tablet (15 mg total) by mouth daily as needed for pain. (Patient not taking: Reported on 01/02/2022), Disp: 30 tablet, Rfl: 0 ?  amphetamine-dextroamphetamine (ADDERALL XR) 20 MG 24 hr capsule, Take 2 capsules (40 mg total) by mouth daily. Take with lunch, Disp: 60 capsule, Rfl: 0 ?  amphetamine-dextroamphetamine (ADDERALL XR) 20 MG 24 hr capsule, Take 2 capsules (40 mg total) by mouth daily. Take with breakfast, Disp: 60 capsule, Rfl: 0 ?  amphetamine-dextroamphetamine (ADDERALL XR) 20 MG 24 hr capsule, Take 2 capsules (40 mg total) by mouth in the morning. Please fill on or after 12/19/21, Disp: 60 capsule, Rfl: 0 ? ?No Known Allergies ? ? ? ? ?   ?Objective:  ?Physical Exam  ?General: AAO x3, NAD ? ?Dermatological: Skin is warm, dry and supple bilateral.  There are no open sores, no preulcerative lesions, no rash or signs of infection present. ? ?Vascular: Dorsalis Pedis artery and Posterior Tibial artery pedal pulses are 2/4 bilateral with immedate capillary fill time. There is no pain with calf compression, swelling, warmth, erythema.  ? ?Neruologic: Grossly intact via light touch bilateral. ? ?Musculoskeletal: Majority of tenderness is  localized to lateral aspect of foot and sinus tarsi.  There is trace edema but there is no erythema or warmth.  No pain with ankle or subtalar range of motion.  No significant pain with course the peroneal tendon today but does get some discomfort generalized in the area.  Flexor, extensor tendons appear to be intact.  Muscular strength 5/5 in all groups tested bilateral. ? ?Gait: Unassisted, Nonantalgic.  ? ? ?   ?Assessment:  ? ?26 year old male with chronic right ankle pain, lateral ? ?   ?Plan:  ?-Treatment options discussed including all alternatives, risks, and complications ?-Etiology of symptoms were discussed ?-X-rays were obtained and reviewed with the patient.  No evidence of acute fracture or stress fracture.  Os trigonum is present. ?-At this point given his ongoing nature of symptoms which has been ongoing for many years I recommend MRI which is ordered.  This is also for potential surgical planning. ?-Prescribed mobic. Discussed side effects of the medication and directed to stop if any are to occur and call the office.  ?-Can use ankle brace as needed.  Supportive shoe gear. ? ?Trula Slade DPM ? ?   ? ?

## 2022-01-08 DIAGNOSIS — Z0000 Encounter for general adult medical examination without abnormal findings: Secondary | ICD-10-CM | POA: Insufficient documentation

## 2022-01-08 NOTE — Telephone Encounter (Signed)
Fors given to provider awaiting the return of the paperwork from provider. ?

## 2022-01-08 NOTE — Assessment & Plan Note (Addendum)
Christian Mccann is going back to [employer].  Christian Mccann will need form for college filled out.  His shots are up-to-date: Tdap was updated ? ? ?We discussed age appropriate health related issues, including available/recomended screening tests and vaccinations. Labs were ordered to be later reviewed . All questions were answered. We discussed one or more of the following - seat belt use, use of sunscreen/sun exposure exercise, fall risk reduction, second hand smoke exposure, firearm use and storage, seat belt use, a need for adhering to healthy diet and exercise. ?Labs were ordered.  All questions were answered. ? ? ?

## 2022-01-16 ENCOUNTER — Other Ambulatory Visit: Payer: Managed Care, Other (non HMO)

## 2022-01-17 NOTE — Telephone Encounter (Signed)
Pts mother requesting a cb for a status update on forms ? ? ?

## 2022-01-19 NOTE — Telephone Encounter (Signed)
Pt mother is calling back with the fax number  ? ?[phone] ?

## 2022-01-19 NOTE — Telephone Encounter (Signed)
Document has been faxed. 

## 2022-01-19 NOTE — Telephone Encounter (Signed)
Pts mother will be calling with a fax number for forms to be sent to as we can not e-mail these documents to an unsecured e-mail address.  ?

## 2022-04-23 ENCOUNTER — Encounter: Payer: Self-pay | Admitting: Internal Medicine

## 2022-04-23 ENCOUNTER — Telehealth (INDEPENDENT_AMBULATORY_CARE_PROVIDER_SITE_OTHER): Payer: Commercial Managed Care - HMO | Admitting: Internal Medicine

## 2022-04-23 DIAGNOSIS — F988 Other specified behavioral and emotional disorders with onset usually occurring in childhood and adolescence: Secondary | ICD-10-CM

## 2022-04-23 MED ORDER — AMPHETAMINE-DEXTROAMPHET ER 20 MG PO CP24: 40.0000 mg | ORAL_CAPSULE | Freq: Every morning | ORAL | 0 refills | Status: DC

## 2022-04-23 MED ORDER — AMPHETAMINE-DEXTROAMPHET ER 20 MG PO CP24: 40.0000 mg | ORAL_CAPSULE | Freq: Every day | ORAL | 0 refills | Status: DC

## 2022-04-23 NOTE — Assessment & Plan Note (Signed)
Doing well Continue with Adderall XR 20 mg bid - am and lunch.  Alternatively, he can take 2 in the morning.    Potential benefits of a long term amphetamines  use as well as potential risks  and complications were explained to the patient and were aknowledged. ROV q 3 months virtual or face-to-face

## 2022-04-23 NOTE — Progress Notes (Signed)
Virtual Visit via Video Note  I connected with Christian Mccann on 04/23/22 at  8:10 AM EDT by a video enabled telemedicine application and verified that I am speaking with the correct person using two identifiers.   I discussed the limitations of evaluation and management by telemedicine and the availability of in person appointments. The patient expressed understanding and agreed to proceed.  I was located at our [facility] office. The patient was at home. There was no one else present in the visit.  No chief complaint on file.    History of Present Illness: Christian Mccann lives in [place] now. He just finished his summer semester well.   Review of Systems  Constitutional:  Negative for weight loss.  Cardiovascular:  Negative for palpitations.  Psychiatric/Behavioral:  Negative for depression, memory loss, substance abuse and suicidal ideas. The patient is not nervous/anxious and does not have insomnia.      Observations/Objective: The patient appears to be in no acute distress, looks well.  Assessment and Plan:  Problem List Items Addressed This Visit     ADD (attention deficit disorder)    Doing well Continue with Adderall XR 20 mg bid - am and lunch.  Alternatively, he can take 2 in the morning.    Potential benefits of a long term amphetamines  use as well as potential risks  and complications were explained to the patient and were aknowledged. ROV q 3 months virtual or face-to-face        Meds ordered this encounter  Medications   amphetamine-dextroamphetamine (ADDERALL XR) 20 MG 24 hr capsule    Sig: Take 2 capsules (40 mg total) by mouth daily. Take with lunch    Dispense:  60 capsule    Refill:  0    Please fill on or after 06/22/22   amphetamine-dextroamphetamine (ADDERALL XR) 20 MG 24 hr capsule    Sig: Take 2 capsules (40 mg total) by mouth daily. Take with breakfast    Dispense:  60 capsule    Refill:  0    Please fill on or after 05/23/22    amphetamine-dextroamphetamine (ADDERALL XR) 20 MG 24 hr capsule    Sig: Take 2 capsules (40 mg total) by mouth in the morning. Please fill on or after 04/23/22    Dispense:  60 capsule    Refill:  0     Follow Up Instructions:    I discussed the assessment and treatment plan with the patient. The patient was provided an opportunity to ask questions and all were answered. The patient agreed with the plan and demonstrated an understanding of the instructions.   The patient was advised to call back or seek an in-person evaluation if the symptoms worsen or if the condition fails to improve as anticipated.  I provided face-to-face time during this encounter. We were at different locations.   Walker Kehr, MD

## 2022-04-25 ENCOUNTER — Telehealth: Payer: Self-pay | Admitting: Internal Medicine

## 2022-04-25 NOTE — Telephone Encounter (Signed)
Pt request transfer of all  meds amphetamine-dextroamphetamine (ADDERALL XR) 20 MG 24 hr capsule  amphetamine-dextroamphetamine (ADDERALL XR) 20 MG 24 hr capsule  amphetamine-dextroamphetamine (ADDERALL XR) 20 MG 24 hr capsule meloxicam (MOBIC) 15 MG tablet.    Transfer from:  [facility] [id number] - [place], [place]      transfer to:     [facility] [address] [place] [address]  Please Advise Phone:  [phone]  Fax:  [phone]     Phone:  [phone]  Fax:  [phone]      Phone:  [phone]  Fax:  [phone]

## 2022-04-30 ENCOUNTER — Telehealth: Payer: Self-pay | Admitting: Internal Medicine

## 2022-04-30 NOTE — Telephone Encounter (Signed)
Pt is requesting refill on amphetamine-dextroamphetamine (ADDERALL XR) 20 MG 24 hr capsule. Rx was sent to [facility] in [place] and they are out of stock. Pt would like rx sent to  [facility], [place] Phone:  [phone]  Fax:  [phone]

## 2022-05-01 MED ORDER — AMPHETAMINE-DEXTROAMPHET ER 20 MG PO CP24: 40.0000 mg | ORAL_CAPSULE | Freq: Every day | ORAL | 0 refills | Status: DC

## 2022-05-01 MED ORDER — AMPHETAMINE-DEXTROAMPHET ER 20 MG PO CP24: 40.0000 mg | ORAL_CAPSULE | Freq: Every morning | ORAL | 0 refills | Status: DC

## 2022-05-01 NOTE — Telephone Encounter (Signed)
Pt is requesting the other 2 months worth of medication be sent to pharmacy.  amphetamine-dextroamphetamine (ADDERALL XR) 20 MG 24 hr capsule on or after 05/23/22 amphetamine-dextroamphetamine (ADDERALL XR) 20 MG 24 hr capsule on or after 06/22/22  Pharmacy: [place], [place]

## 2022-05-01 NOTE — Telephone Encounter (Signed)
Okay.  Thanks.

## 2022-05-01 NOTE — Telephone Encounter (Signed)
Ok thx.

## 2022-08-20 ENCOUNTER — Encounter: Payer: Self-pay | Admitting: Internal Medicine

## 2022-08-20 ENCOUNTER — Telehealth (INDEPENDENT_AMBULATORY_CARE_PROVIDER_SITE_OTHER): Payer: Commercial Managed Care - HMO | Admitting: Internal Medicine

## 2022-08-20 DIAGNOSIS — F988 Other specified behavioral and emotional disorders with onset usually occurring in childhood and adolescence: Secondary | ICD-10-CM

## 2022-08-20 MED ORDER — AMPHETAMINE-DEXTROAMPHET ER 20 MG PO CP24: 40.0000 mg | ORAL_CAPSULE | Freq: Every day | ORAL | 0 refills | Status: DC

## 2022-08-20 MED ORDER — AMPHETAMINE-DEXTROAMPHET ER 20 MG PO CP24: 40.0000 mg | ORAL_CAPSULE | Freq: Every morning | ORAL | 0 refills | Status: DC

## 2022-08-20 NOTE — Assessment & Plan Note (Signed)
Continue with Adderall XR 20 mg bid - am and lunch.  Alternatively, he can take 2 in the morning.    Potential benefits of a long term amphetamines  use as well as potential risks  and complications were explained to the patient and were aknowledged. ROV q 3 months virtual or face-to-face

## 2022-08-20 NOTE — Progress Notes (Signed)
Virtual Visit via Video Note  I connected with Christian Mccann on 08/20/22 at  1:20 PM EDT by a video enabled telemedicine application and verified that I am speaking with the correct person using two identifiers.   I discussed the limitations of evaluation and management by telemedicine and the availability of in person appointments. The patient expressed understanding and agreed to proceed.  I was located at our [facility] office. The patient was at home. There was no one else present in the visit.  No chief complaint on file.    History of Present Illness:  F/u on ADHD Christian Mccann is doing well on meds  Review of Systems  Cardiovascular:  Negative for chest pain and palpitations.  Psychiatric/Behavioral:  Negative for depression, hallucinations, memory loss and substance abuse. The patient is not nervous/anxious.      Observations/Objective: The patient appears to be in no acute distress  Assessment and Plan:  Problem List Items Addressed This Visit     ADD (attention deficit disorder)    Continue with Adderall XR 20 mg bid - am and lunch.  Alternatively, he can take 2 in the morning.    Potential benefits of a long term amphetamines  use as well as potential risks  and complications were explained to the patient and were aknowledged. ROV q 3 months virtual or face-to-face        Meds ordered this encounter  Medications   amphetamine-dextroamphetamine (ADDERALL XR) 20 MG 24 hr capsule    Sig: Take 2 capsules (40 mg total) by mouth in the morning. Please fill on or after 10/19/22    Dispense:  60 capsule    Refill:  0   amphetamine-dextroamphetamine (ADDERALL XR) 20 MG 24 hr capsule    Sig: Take 2 capsules (40 mg total) by mouth daily. Take with breakfast    Dispense:  60 capsule    Refill:  0    Please fill on or after 09/19/22   amphetamine-dextroamphetamine (ADDERALL XR) 20 MG 24 hr capsule    Sig: Take 2 capsules (40 mg total) by mouth daily. Take with  lunch    Dispense:  60 capsule    Refill:  0    Please fill on or after 08/20/22     Follow Up Instructions:    I discussed the assessment and treatment plan with the patient. The patient was provided an opportunity to ask questions and all were answered. The patient agreed with the plan and demonstrated an understanding of the instructions.   The patient was advised to call back or seek an in-person evaluation if the symptoms worsen or if the condition fails to improve as anticipated.  I provided face-to-face time during this encounter. We were at different locations.   Walker Kehr, MD

## 2023-01-07 ENCOUNTER — Other Ambulatory Visit (HOSPITAL_COMMUNITY): Payer: Self-pay

## 2023-01-08 ENCOUNTER — Other Ambulatory Visit (HOSPITAL_COMMUNITY): Payer: Self-pay

## 2023-01-14 ENCOUNTER — Other Ambulatory Visit (HOSPITAL_COMMUNITY): Payer: Self-pay

## 2023-01-21 ENCOUNTER — Telehealth (INDEPENDENT_AMBULATORY_CARE_PROVIDER_SITE_OTHER): Payer: Commercial Managed Care - HMO | Admitting: Internal Medicine

## 2023-01-21 DIAGNOSIS — F988 Other specified behavioral and emotional disorders with onset usually occurring in childhood and adolescence: Secondary | ICD-10-CM | POA: Diagnosis not present

## 2023-01-28 ENCOUNTER — Encounter: Payer: Self-pay | Admitting: Internal Medicine

## 2023-01-28 NOTE — Progress Notes (Signed)
Virtual Visit via Video Note  I connected with Christian Mccann on 01/28/23 at 11:20 AM EDT by a video enabled telemedicine application and verified that I am speaking with the correct person using two identifiers.   I discussed the limitations of evaluation and management by telemedicine and the availability of in person appointments. The patient expressed understanding and agreed to proceed.  I was located at our [facility] office. The patient was at home. There was no one else present in the visit.  No chief complaint on file.    History of Present Illness:  Follow-up on attention deficit disorder.  Christian Mccann has been doing well in school and socially  Review of Systems  Constitutional:  Negative for malaise/fatigue and weight loss.  Psychiatric/Behavioral:  Negative for depression, memory loss, substance abuse and suicidal ideas. The patient is not nervous/anxious and does not have insomnia.      Observations/Objective: The patient appears to be in no acute distress  Assessment and Plan:  Problem List Items Addressed This Visit       Other   ADD (attention deficit disorder) - Primary    Continue with Adderall XR 20 mg bid - am and lunch.  Alternatively, he can take 2 in the morning.    Potential benefits of a long term amphetamines  use as well as potential risks  and complications were explained to the patient and were aknowledged. ROV q 3 months virtual or face-to-face        No orders of the defined types were placed in this encounter.    Follow Up Instructions:    I discussed the assessment and treatment plan with the patient. The patient was provided an opportunity to ask questions and all were answered. The patient agreed with the plan and demonstrated an understanding of the instructions.   The patient was advised to call back or seek an in-person evaluation if the symptoms worsen or if the condition fails to improve as anticipated.  I provided  face-to-face time during this encounter. We were at different locations.   Sonda Primes, MD

## 2023-01-28 NOTE — Assessment & Plan Note (Signed)
Continue with Adderall XR 20 mg bid - am and lunch.  Alternatively, he can take 2 in the morning.    Potential benefits of a long term amphetamines  use as well as potential risks  and complications were explained to the patient and were aknowledged. ROV q 3 months virtual or face-to-face 

## 2023-05-28 ENCOUNTER — Encounter: Payer: Self-pay | Admitting: Internal Medicine

## 2023-05-28 ENCOUNTER — Telehealth (INDEPENDENT_AMBULATORY_CARE_PROVIDER_SITE_OTHER): Payer: Commercial Managed Care - HMO | Admitting: Internal Medicine

## 2023-05-28 DIAGNOSIS — F39 Unspecified mood [affective] disorder: Secondary | ICD-10-CM | POA: Diagnosis not present

## 2023-05-28 DIAGNOSIS — F988 Other specified behavioral and emotional disorders with onset usually occurring in childhood and adolescence: Secondary | ICD-10-CM | POA: Diagnosis not present

## 2023-05-28 MED ORDER — AMPHETAMINE-DEXTROAMPHET ER 20 MG PO CP24: 40.0000 mg | ORAL_CAPSULE | Freq: Every day | ORAL | 0 refills | Status: DC

## 2023-05-28 MED ORDER — AMPHETAMINE-DEXTROAMPHET ER 20 MG PO CP24: 40.0000 mg | ORAL_CAPSULE | Freq: Every morning | ORAL | 0 refills | Status: DC

## 2023-05-28 NOTE — Assessment & Plan Note (Signed)
Doing well 

## 2023-05-28 NOTE — Progress Notes (Signed)
Virtual Visit via Video Note  I connected with Christian Mccann on 05/28/23 at  3:20 PM EDT by a video enabled telemedicine application and verified that I am speaking with the correct person using two identifiers.   I discussed the limitations of evaluation and management by telemedicine and the availability of in person appointments. The patient expressed understanding and agreed to proceed.  I was located at our [facility] office. The patient was at home. There was no one else present in the visit.  No chief complaint on file.    History of Present Illness:  Follow-up on ADHD Review of Systems  Constitutional:  Negative for malaise/fatigue and weight loss.  Psychiatric/Behavioral:  Negative for depression. The patient does not have insomnia.      Observations/Objective: The patient appears to be in no acute distress  Assessment and Plan:  Problem List Items Addressed This Visit     Mood disorder (HCC) - Primary    Doing well      ADD (attention deficit disorder)    Continue with Adderall XR 20 mg bid - am and lunch.  Alternatively, he can take 2 in the morning.    Potential benefits of a long term amphetamines  use as well as potential risks  and complications were explained to the patient and were aknowledged. ROV q 3 months virtual or face-to-face        Meds ordered this encounter  Medications   amphetamine-dextroamphetamine (ADDERALL XR) 20 MG 24 hr capsule    Sig: Take 2 capsules (40 mg total) by mouth in the morning. Please fill on or after 05/28/23    Dispense:  60 capsule    Refill:  0   amphetamine-dextroamphetamine (ADDERALL XR) 20 MG 24 hr capsule    Sig: Take 2 capsules (40 mg total) by mouth daily. Take with breakfast    Dispense:  60 capsule    Refill:  0    Please fill on or after 06/27/23   amphetamine-dextroamphetamine (ADDERALL XR) 20 MG 24 hr capsule    Sig: Take 2 capsules (40 mg total) by mouth daily. Take with lunch    Dispense:  60  capsule    Refill:  0    Please fill on or after 07/27/23     Follow Up Instructions:    I discussed the assessment and treatment plan with the patient. The patient was provided an opportunity to ask questions and all were answered. The patient agreed with the plan and demonstrated an understanding of the instructions.   The patient was advised to call back or seek an in-person evaluation if the symptoms worsen or if the condition fails to improve as anticipated.  I provided face-to-face time during this encounter. We were at different locations.   Sonda Primes, MD

## 2023-05-28 NOTE — Assessment & Plan Note (Signed)
Continue with Adderall XR 20 mg bid - am and lunch.  Alternatively, he can take 2 in the morning.    Potential benefits of a long term amphetamines  use as well as potential risks  and complications were explained to the patient and were aknowledged. ROV q 3 months virtual or face-to-face 

## 2023-09-02 ENCOUNTER — Telehealth: Payer: Self-pay | Admitting: Internal Medicine

## 2023-09-09 ENCOUNTER — Telehealth (INDEPENDENT_AMBULATORY_CARE_PROVIDER_SITE_OTHER): Payer: Self-pay | Admitting: Internal Medicine

## 2023-09-09 ENCOUNTER — Encounter: Payer: Self-pay | Admitting: Internal Medicine

## 2023-09-09 DIAGNOSIS — R41840 Attention and concentration deficit: Secondary | ICD-10-CM | POA: Diagnosis not present

## 2023-09-09 NOTE — Progress Notes (Signed)
Virtual Visit via Video Note  I connected with Maretta Los on 09/09/23 at 10:20 AM EST by a video enabled telemedicine application and verified that I am speaking with the correct person using two identifiers.   I discussed the limitations of evaluation and management by telemedicine and the availability of in person appointments. The patient expressed understanding and agreed to proceed.  I was located at our [facility] office. The patient was at home. There was no one else present in the visit.  Chief Complaint  Patient presents with   Medical Management of Chronic Issues     History of Present Illness:   Review of Systems  Neurological:  Negative for weakness.  Psychiatric/Behavioral:  Negative for depression.      Observations/Objective: The patient appears to be in no acute distress  Assessment and Plan:  Problem List Items Addressed This Visit     ADD (attention deficit disorder) - Primary    Continue with Adderall XR 20 mg bid - am and lunch.  Alternatively, he can take 2 in the morning.    Potential benefits of a long term amphetamines  use as well as potential risks  and complications were explained to the patient and were aknowledged. ROV q 3 months virtual or face-to-face        No orders of the defined types were placed in this encounter.    Follow Up Instructions:    I discussed the assessment and treatment plan with the patient. The patient was provided an opportunity to ask questions and all were answered. The patient agreed with the plan and demonstrated an understanding of the instructions.   The patient was advised to call back or seek an in-person evaluation if the symptoms worsen or if the condition fails to improve as anticipated.  I provided face-to-face time during this encounter. We were at different locations.   Sonda Primes, MD

## 2023-09-09 NOTE — Assessment & Plan Note (Signed)
Continue with Adderall XR 20 mg bid - am and lunch.  Alternatively, he can take 2 in the morning.    Potential benefits of a long term amphetamines  use as well as potential risks  and complications were explained to the patient and were aknowledged. ROV q 3 months virtual or face-to-face 

## 2023-10-04 ENCOUNTER — Telehealth: Payer: Self-pay | Admitting: Internal Medicine

## 2023-10-04 NOTE — Telephone Encounter (Signed)
Prescription Request  10/04/2023  LVV: 09/09/2023  What is the name of the medication or equipment?  amphetamine-dextroamphetamine (ADDERALL XR) 20 MG 24 hr capsule  Have you contacted your pharmacy to request a refill? Yes   Which pharmacy would you like this sent to?  [facility] [place], [place] - [address] [place] [address] Phone: [phone] Fax: [phone]    Patient notified that their request is being sent to the clinical staff for review and that they should receive a response within 2 business days.   Please advise at Mobile [phone] (mobile)

## 2023-10-09 MED ORDER — AMPHETAMINE-DEXTROAMPHET ER 20 MG PO CP24: 40.0000 mg | ORAL_CAPSULE | Freq: Every morning | ORAL | 0 refills | Status: DC

## 2023-10-09 MED ORDER — AMPHETAMINE-DEXTROAMPHET ER 20 MG PO CP24: 40.0000 mg | ORAL_CAPSULE | Freq: Every day | ORAL | 0 refills | Status: DC

## 2023-10-09 NOTE — Telephone Encounter (Signed)
Is done.  Thanks.

## 2024-02-20 ENCOUNTER — Telehealth: Payer: Self-pay

## 2024-02-20 ENCOUNTER — Other Ambulatory Visit (HOSPITAL_COMMUNITY): Payer: Self-pay

## 2024-02-20 NOTE — Telephone Encounter (Signed)
 Pharmacy Patient Advocate Encounter   Received notification from CoverMyMeds that prior authorization for Amphetamine -Dextroamphet ER 20MG  er capsules is required/requested.   Insurance verification completed.   The patient is insured through [employer] .   Per test claim: PA required; PA submitted to above mentioned insurance via CoverMyMeds Key/confirmation #/EOC (Key: [id number])   Status is pending

## 2024-02-21 ENCOUNTER — Other Ambulatory Visit (HOSPITAL_COMMUNITY): Payer: Self-pay

## 2024-02-21 NOTE — Telephone Encounter (Signed)
 Pharmacy Patient Advocate Encounter  Received notification from [facility] that Prior Authorization for Amphetamine -Dextroamphet ER 20MG  er capsules has been APPROVED from 4.17.25 to 5.1.26. Ran test claim, Copay is $RTS AS THE RX WAS LAST FILLED ON 4.30.25. This test claim was processed through [facility]- copay amounts may vary at other pharmacies due to pharmacy/plan contracts, or as the patient moves through the different stages of their insurance plan.   PA #/Case ID/Reference #: (Key: [id number])

## 2024-04-20 ENCOUNTER — Other Ambulatory Visit: Payer: Self-pay | Admitting: Internal Medicine

## 2024-04-20 NOTE — Telephone Encounter (Unsigned)
 Copied from CRM [id number]. Topic: Clinical - Medication Refill >> Apr 20, 2024  9:12 AM Suzen RAMAN wrote: Medication: amphetamine -dextroamphetamine (ADDERALL XR) 20 MG 24 hr capsule (Patient just needs medication bridged til his next appt 04/23/24)  Has the patient contacted their pharmacy? Yes   This is the patient's preferred pharmacy:  [facility] [address], [place], [place] [address] [phone]  Is this the correct pharmacy for this prescription? Yes If no, delete pharmacy and type the correct one.   Has the prescription been filled recently? No  Is the patient out of the medication? Yes  Has the patient been seen for an appointment in the last year OR does the patient have an upcoming appointment? Yes  Can we respond through MyChart? Yes  Agent: Please be advised that Rx refills may take up to 3 business days. We ask that you follow-up with your pharmacy.

## 2024-04-21 ENCOUNTER — Telehealth: Payer: Self-pay | Admitting: Internal Medicine

## 2024-04-21 NOTE — Telephone Encounter (Signed)
 Copied from CRM [id number]. Topic: Clinical - Medication Refill >> Apr 20, 2024  9:12 AM Suzen RAMAN wrote: Medication: amphetamine -dextroamphetamine (ADDERALL XR) 20 MG 24 hr capsule (Patient just needs medication bridged til his next appt 04/23/24)  Has the patient contacted their pharmacy? Yes   This is the patient's preferred pharmacy:  [facility] [address], [place], [place] [address] [phone]  Is this the correct pharmacy for this prescription? Yes If no, delete pharmacy and type the correct one.   Has the prescription been filled recently? No  Is the patient out of the medication? Yes  Has the patient been seen for an appointment in the last year OR does the patient have an upcoming appointment? Yes  Can we respond through MyChart? Yes  Agent: Please be advised that Rx refills may take up to 3 business days. We ask that you follow-up with your pharmacy. >> Apr 21, 2024  9:43 AM Franky GRADE wrote: Patient's mother is calling to follow up on this medication request, advised of the 3 day turn around time. She would like to know if there is anyway to expedite the process.

## 2024-04-22 NOTE — Telephone Encounter (Unsigned)
 Copied from CRM [id number]. Topic: Clinical - Medication Question >> Apr 22, 2024  8:36 AM Christian Mccann wrote: Reason for CRM: Patient's mother is calling to follow up on this medication request, advised of the 3 day turn around time. She she stated she does not care, she wants the medication sent in now    amphetamine -dextroamphetamine (ADDERALL XR) 20 MG 24 hr capsule

## 2024-04-23 ENCOUNTER — Encounter: Payer: Self-pay | Admitting: Internal Medicine

## 2024-04-23 ENCOUNTER — Telehealth: Admitting: Internal Medicine

## 2024-04-23 DIAGNOSIS — R41840 Attention and concentration deficit: Secondary | ICD-10-CM | POA: Diagnosis not present

## 2024-04-23 MED ORDER — AMPHETAMINE-DEXTROAMPHET ER 20 MG PO CP24: 40.0000 mg | ORAL_CAPSULE | Freq: Every day | ORAL | 0 refills | Status: DC

## 2024-04-23 MED ORDER — AMPHETAMINE-DEXTROAMPHET ER 20 MG PO CP24: 40.0000 mg | ORAL_CAPSULE | Freq: Every morning | ORAL | 0 refills | Status: DC

## 2024-04-23 NOTE — Progress Notes (Signed)
 Virtual Visit via Video Note  I connected with Christian Mccann on 04/23/24 at  9:30 AM EDT by a video enabled telemedicine application and verified that I am speaking with the correct person using two identifiers.   I discussed the limitations of evaluation and management by telemedicine and the availability of in person appointments. The patient expressed understanding and agreed to proceed.  I was located at our [facility] office. The patient was at home. There was no one else present in the visit.  No chief complaint on file.    History of Present Illness:  Follow-up on ADHD Christian Mccann graduated in May 2025 with global studies degree  Review of Systems  Cardiovascular:  Negative for palpitations.  Psychiatric/Behavioral:  Negative for depression and suicidal ideas. The patient is not nervous/anxious.      Observations/Objective: The patient appears to be in no acute distress  Assessment and Plan:  Problem List Items Addressed This Visit     ADD (attention deficit disorder) - Primary   Continue with Adderall XR 20 mg bid - am and lunch.  Alternatively, he can take 2 in the morning.    Potential benefits of a long term amphetamines  use as well as potential risks  and complications were explained to the patient and were aknowledged. RTC for a well exam        Meds ordered this encounter  Medications   amphetamine -dextroamphetamine (ADDERALL XR) 20 MG 24 hr capsule    Sig: Take 2 capsules (40 mg total) by mouth daily.    Dispense:  60 capsule    Refill:  0    Please fill on or after 05/23/24   amphetamine -dextroamphetamine (ADDERALL XR) 20 MG 24 hr capsule    Sig: Take 2 capsules (40 mg total) by mouth in the morning. Please fill on or after 06/22/24    Dispense:  60 capsule    Refill:  0   amphetamine -dextroamphetamine (ADDERALL XR) 20 MG 24 hr capsule    Sig: Take 2 capsules (40 mg total) by mouth daily. Take with breakfast    Dispense:  60 capsule    Refill:  0     Please fill on or after 04/23/24.     Follow Up Instructions:    I discussed the assessment and treatment plan with the patient. The patient was provided an opportunity to ask questions and all were answered. The patient agreed with the plan and demonstrated an understanding of the instructions.   The patient was advised to call back or seek an in-person evaluation if the symptoms worsen or if the condition fails to improve as anticipated.  I provided face-to-face time during this encounter. We were at different locations.   Marolyn Noel, MD

## 2024-04-23 NOTE — Assessment & Plan Note (Signed)
 Continue with Adderall XR 20 mg bid - am and lunch.  Alternatively, he can take 2 in the morning.    Potential benefits of a long term amphetamines  use as well as potential risks  and complications were explained to the patient and were aknowledged. RTC for a well exam

## 2024-04-23 NOTE — Telephone Encounter (Signed)
 I renewed his medication.  He needs to see me every 3 months.  Thanks

## 2024-06-29 ENCOUNTER — Encounter: Payer: Self-pay | Admitting: Internal Medicine

## 2024-06-29 ENCOUNTER — Ambulatory Visit (INDEPENDENT_AMBULATORY_CARE_PROVIDER_SITE_OTHER): Admitting: Internal Medicine

## 2024-06-29 VITALS — BP 120/80 | HR 79 | Temp 97.9°F | Ht 76.0 in | Wt 245.6 lb

## 2024-06-29 DIAGNOSIS — Z13220 Encounter for screening for lipoid disorders: Secondary | ICD-10-CM | POA: Diagnosis not present

## 2024-06-29 DIAGNOSIS — Z0000 Encounter for general adult medical examination without abnormal findings: Secondary | ICD-10-CM | POA: Diagnosis not present

## 2024-06-29 DIAGNOSIS — R41840 Attention and concentration deficit: Secondary | ICD-10-CM | POA: Diagnosis not present

## 2024-06-29 LAB — COMPREHENSIVE METABOLIC PANEL WITH GFR
ALT: 23 U/L (ref 0–53)
AST: 21 U/L (ref 0–37)
Albumin: 4.5 g/dL (ref 3.5–5.2)
Alkaline Phosphatase: 52 U/L (ref 39–117)
BUN: 12 mg/dL (ref 6–23)
CO2: 28 meq/L (ref 19–32)
Calcium: 9.7 mg/dL (ref 8.4–10.5)
Chloride: 104 meq/L (ref 96–112)
Creatinine, Ser: 1.16 mg/dL (ref 0.40–1.50)
GFR: 84.93 mL/min (ref >60.00)
Glucose, Bld: 73 mg/dL (ref 70–99)
Potassium: 4 meq/L (ref 3.5–5.1)
Sodium: 140 meq/L (ref 135–145)
Total Bilirubin: 0.6 mg/dL (ref 0.2–1.2)
Total Protein: 7.6 g/dL (ref 6.0–8.3)

## 2024-06-29 LAB — LIPID PANEL
Cholesterol: 200 mg/dL (ref 0–200)
HDL: 58 mg/dL (ref >39.00)
LDL Cholesterol: 120 mg/dL — ABNORMAL HIGH (ref 0–99)
NonHDL: 141.86
Total CHOL/HDL Ratio: 3
Triglycerides: 108 mg/dL (ref 0.0–149.0)
VLDL: 21.6 mg/dL (ref 0.0–40.0)

## 2024-06-29 LAB — CBC WITH DIFFERENTIAL/PLATELET
Basophils Absolute: 0 K/uL (ref 0.0–0.1)
Basophils Relative: 0.5 % (ref 0.0–3.0)
Eosinophils Absolute: 0.2 K/uL (ref 0.0–0.7)
Eosinophils Relative: 2.3 % (ref 0.0–5.0)
HCT: 49.2 % (ref 39.0–52.0)
Hemoglobin: 16.7 g/dL (ref 13.0–17.0)
Lymphocytes Relative: 25.9 % (ref 12.0–46.0)
Lymphs Abs: 2.2 K/uL (ref 0.7–4.0)
MCHC: 33.9 g/dL (ref 30.0–36.0)
MCV: 87.2 fl (ref 78.0–100.0)
Monocytes Absolute: 0.9 K/uL (ref 0.1–1.0)
Monocytes Relative: 10 % (ref 3.0–12.0)
Neutro Abs: 5.3 K/uL (ref 1.4–7.7)
Neutrophils Relative %: 61.3 % (ref 43.0–77.0)
Platelets: 288 K/uL (ref 150.0–400.0)
RBC: 5.63 Mil/uL (ref 4.22–5.81)
RDW: 13.6 % (ref 11.5–15.5)
WBC: 8.6 K/uL (ref 4.0–10.5)

## 2024-06-29 LAB — TSH: TSH: 1.14 u[IU]/mL (ref 0.35–5.50)

## 2024-06-29 MED ORDER — AMPHETAMINE-DEXTROAMPHET ER 20 MG PO CP24: 40.0000 mg | ORAL_CAPSULE | Freq: Every morning | ORAL | 0 refills | Status: AC

## 2024-06-29 MED ORDER — AMPHETAMINE-DEXTROAMPHET ER 20 MG PO CP24: 40.0000 mg | ORAL_CAPSULE | Freq: Every day | ORAL | 0 refills | Status: AC

## 2024-06-29 NOTE — Assessment & Plan Note (Signed)

## 2024-06-29 NOTE — Progress Notes (Signed)
 Subjective:  Patient ID: Christian Mccann, male    DOB: 1996-08-07  Age: 28 y.o. MRN: [id number]  CC: Annual Exam (Patient states he is just here because he forgot to get blood work done last time. )   HPI Christian Mccann presents for a well exam.  He is doing well. Follow-up on ADHD.  Enjoys playing golf.  He lost weight on a good diet  Outpatient Medications Prior to Visit  Medication Sig Dispense Refill   amphetamine -dextroamphetamine (ADDERALL XR) 20 MG 24 hr capsule Take 2 capsules (40 mg total) by mouth daily. 60 capsule 0   amphetamine -dextroamphetamine (ADDERALL XR) 20 MG 24 hr capsule Take 2 capsules (40 mg total) by mouth in the morning. Please fill on or after 06/22/24 60 capsule 0   amphetamine -dextroamphetamine (ADDERALL XR) 20 MG 24 hr capsule Take 2 capsules (40 mg total) by mouth daily. Take with breakfast 60 capsule 0   chlorhexidine (PERIDEX) 0.12 % solution SMARTSIG:By Mouth (Patient not taking: Reported on 06/29/2024)     No facility-administered medications prior to visit.    ROS: Review of Systems  Constitutional:  Negative for appetite change, fatigue and unexpected weight change.  HENT:  Negative for congestion, nosebleeds, sneezing, sore throat and trouble swallowing.   Eyes:  Negative for itching and visual disturbance.  Respiratory:  Negative for cough.   Cardiovascular:  Negative for chest pain, palpitations and leg swelling.  Gastrointestinal:  Negative for abdominal distention, blood in stool, diarrhea and nausea.  Genitourinary:  Negative for frequency and hematuria.  Musculoskeletal:  Negative for back pain, gait problem, joint swelling and neck pain.  Skin:  Negative for rash.  Neurological:  Negative for dizziness, tremors, speech difficulty and weakness.  Psychiatric/Behavioral:  Positive for decreased concentration. Negative for agitation, behavioral problems, confusion, dysphoric mood, sleep disturbance and suicidal ideas. The patient is not  nervous/anxious and is not hyperactive.     Objective:  BP 120/80   Pulse 79   Temp 97.9 F (36.6 C) (Oral)   Ht 6' 4 (1.93 m)   Wt 245 lb 9.6 oz (111.4 kg)   SpO2 97%   BMI 29.90 kg/m   BP Readings from Last 3 Encounters:  06/29/24 120/80  01/02/22 120/76  08/31/20 110/76    Wt Readings from Last 3 Encounters:  06/29/24 245 lb 9.6 oz (111.4 kg)  01/02/22 270 lb 6 oz (122.6 kg)  08/31/20 254 lb 3.2 oz (115.3 kg)    Physical Exam Constitutional:      General: He is not in acute distress.    Appearance: Normal appearance. He is well-developed.     Comments: NAD  Eyes:     Conjunctiva/sclera: Conjunctivae normal.     Pupils: Pupils are equal, round, and reactive to light.  Neck:     Thyroid: No thyromegaly.     Vascular: No JVD.  Cardiovascular:     Rate and Rhythm: Normal rate and regular rhythm.     Heart sounds: Normal heart sounds. No murmur heard.    No friction rub. No gallop.  Pulmonary:     Effort: Pulmonary effort is normal. No respiratory distress.     Breath sounds: Normal breath sounds. No wheezing or rales.  Chest:     Chest wall: No tenderness.  Abdominal:     General: Bowel sounds are normal. There is no distension.     Palpations: Abdomen is soft. There is no mass.     Tenderness: There is no abdominal  tenderness. There is no guarding or rebound.  Musculoskeletal:        General: No tenderness. Normal range of motion.     Cervical back: Normal range of motion.  Lymphadenopathy:     Cervical: No cervical adenopathy.  Skin:    General: Skin is warm and dry.     Findings: No rash.  Neurological:     Mental Status: He is alert and oriented to person, place, and time.     Cranial Nerves: No cranial nerve deficit.     Motor: No abnormal muscle tone.     Coordination: Coordination normal.     Gait: Gait normal.     Deep Tendon Reflexes: Reflexes are normal and symmetric.  Psychiatric:        Behavior: Behavior normal.        Thought Content:  Thought content normal.        Judgment: Judgment normal.    Testes -self exam   Lab Results  Component Value Date   WBC 8.6 08/11/2015   HGB 17.4 (H) 08/11/2015   HCT 51.8 08/11/2015   PLT 283.0 08/11/2015   GLUCOSE 91 08/11/2015   CHOL 153 06/10/2014   TRIG 127.0 06/10/2014   HDL 54.50 06/10/2014   LDLCALC 73 06/10/2014   ALT 14 08/11/2015   AST 17 08/11/2015   NA 140 08/11/2015   K 4.4 08/11/2015   CL 103 08/11/2015   CREATININE 1.09 08/11/2015   BUN 13 08/11/2015   CO2 30 08/11/2015   TSH 1.58 06/10/2014    DG Wrist Complete Right Result Date: 08/21/2018 CLINICAL DATA:  Wrist injury, pain EXAM: RIGHT WRIST - COMPLETE 3+ VIEW COMPARISON:  None. FINDINGS: There is no evidence of fracture or dislocation. There is no evidence of arthropathy or other focal bone abnormality. Soft tissues are unremarkable. IMPRESSION: No acute osseous finding Electronically Signed   By: CHRISTELLA.  Shick M.D.   On: 08/21/2018 08:57    Assessment & Plan:   Problem List Items Addressed This Visit     ADD (attention deficit disorder)   Continue with Adderall XR 20 mg bid - am and lunch.  Alternatively, he can take 2 in the morning.    Potential benefits of a long term amphetamines  use as well as potential risks  and complications were explained to the patient and were aknowledged. RTC for a well exam      Well adult exam - Primary    We discussed age appropriate health related issues, including available/recomended screening tests and vaccinations. Labs were ordered to be later reviewed . All questions were answered. We discussed one or more of the following - seat belt use, use of sunscreen/sun exposure exercise, fall risk reduction, second hand smoke exposure, firearm use and storage, seat belt use, a need for adhering to healthy diet and exercise. Labs were ordered.  All questions were answered.       Relevant Orders   TSH   Urinalysis   CBC with Differential/Platelet   Lipid panel    Comprehensive metabolic panel with GFR   HIV Antibody (routine testing w rflx)      Meds ordered this encounter  Medications   amphetamine -dextroamphetamine (ADDERALL XR) 20 MG 24 hr capsule    Sig: Take 2 capsules (40 mg total) by mouth daily.    Dispense:  60 capsule    Refill:  0    Please fill on or after 06/29/24   amphetamine -dextroamphetamine (ADDERALL XR) 20 MG 24 hr  capsule    Sig: Take 2 capsules (40 mg total) by mouth in the morning. Please fill on or after 07/28/24    Dispense:  60 capsule    Refill:  0   amphetamine -dextroamphetamine (ADDERALL XR) 20 MG 24 hr capsule    Sig: Take 2 capsules (40 mg total) by mouth daily. Take with breakfast    Dispense:  60 capsule    Refill:  0    Please fill on or after 08/27/24      Follow-up: Return in about 3 months (around 09/28/2024) for a follow-up visit.  Marolyn Noel, MD

## 2024-06-29 NOTE — Assessment & Plan Note (Signed)
 Continue with Adderall XR 20 mg bid - am and lunch.  Alternatively, he can take 2 in the morning.    Potential benefits of a long term amphetamines  use as well as potential risks  and complications were explained to the patient and were aknowledged. RTC for a well exam

## 2024-06-30 LAB — URINALYSIS
Bilirubin Urine: NEGATIVE
Hgb urine dipstick: NEGATIVE
Ketones, ur: NEGATIVE
Leukocytes,Ua: NEGATIVE
Nitrite: NEGATIVE
Specific Gravity, Urine: 1.025 (ref 1.000–1.030)
Total Protein, Urine: NEGATIVE
Urine Glucose: NEGATIVE
Urobilinogen, UA: 1 (ref 0.0–1.0)
pH: 6 (ref 5.0–8.0)

## 2024-06-30 LAB — HIV ANTIBODY (ROUTINE TESTING W REFLEX)
HIV 1&2 Ab, 4th Generation: NONREACTIVE
HIV FINAL INTERPRETATION: NEGATIVE

## 2024-07-01 ENCOUNTER — Ambulatory Visit: Payer: Self-pay | Admitting: Internal Medicine

## 2024-07-15 ENCOUNTER — Other Ambulatory Visit: Payer: Self-pay | Admitting: Internal Medicine

## 2024-07-15 NOTE — Telephone Encounter (Unsigned)
 Copied from CRM #[id number]. Topic: Clinical - Medication Question >> Jul 15, 2024  2:09 PM Mesmerise C wrote: Reason for CRM: Patient would like his amphetamine -dextroamphetamine (ADDERALL XR) 20 MG 24 hr capsule on his next 2 refills to go to [facility] [address] [place], [place] [address] [phone]
